# Patient Record
Sex: Male | Born: 1960
Health system: Southern US, Community
[De-identification: ages and names within clinical notes are randomized; demographics above are authoritative.]

## PROBLEM LIST (undated history)

## (undated) DIAGNOSIS — I10 Essential (primary) hypertension: Secondary | ICD-10-CM

## (undated) HISTORY — DX: Essential (primary) hypertension: I10

---

## 1980-09-12 HISTORY — PX: PILONIDAL CYST EXCISION: SHX744

## 2013-01-08 ENCOUNTER — Emergency Department (HOSPITAL_BASED_OUTPATIENT_CLINIC_OR_DEPARTMENT_OTHER)
Admission: EM | Admit: 2013-01-08 | Discharge: 2013-01-08 | Disposition: A | Discharge status: Home / Self Care | Payer: Managed Care, Other (non HMO) | Attending: Emergency Medicine | Admitting: Emergency Medicine

## 2013-01-08 ENCOUNTER — Emergency Department (HOSPITAL_BASED_OUTPATIENT_CLINIC_OR_DEPARTMENT_OTHER): Payer: Managed Care, Other (non HMO)

## 2013-01-08 ENCOUNTER — Encounter (HOSPITAL_BASED_OUTPATIENT_CLINIC_OR_DEPARTMENT_OTHER): Payer: Self-pay | Admitting: *Deleted

## 2013-01-08 DIAGNOSIS — R748 Abnormal levels of other serum enzymes: Secondary | ICD-10-CM | POA: Insufficient documentation

## 2013-01-08 DIAGNOSIS — R1013 Epigastric pain: Secondary | ICD-10-CM | POA: Insufficient documentation

## 2013-01-08 DIAGNOSIS — R109 Unspecified abdominal pain: Secondary | ICD-10-CM

## 2013-01-08 LAB — COMPREHENSIVE METABOLIC PANEL
ALT: 20 U/L (ref 0–53)
AST: 16 U/L (ref 0–37)
Albumin: 4.3 g/dL (ref 3.5–5.2)
Alkaline Phosphatase: 63 U/L (ref 39–117)
BUN: 16 mg/dL (ref 6–23)
CO2: 25 mEq/L (ref 19–32)
Calcium: 9.3 mg/dL (ref 8.4–10.5)
Chloride: 104 mEq/L (ref 96–112)
Creatinine, Ser: 1.1 mg/dL (ref 0.50–1.35)
GFR calc Af Amer: 87 mL/min — ABNORMAL LOW (ref >90)
GFR calc non Af Amer: 75 mL/min — ABNORMAL LOW (ref >90)
Glucose, Bld: 88 mg/dL (ref 70–99)
Potassium: 4.1 mEq/L (ref 3.5–5.1)
Sodium: 140 mEq/L (ref 135–145)
Total Bilirubin: 0.3 mg/dL (ref 0.3–1.2)
Total Protein: 7.5 g/dL (ref 6.0–8.3)

## 2013-01-08 LAB — CBC WITH DIFFERENTIAL/PLATELET
Basophils Absolute: 0.1 10*3/uL (ref 0.0–0.1)
Basophils Relative: 1 % (ref 0–1)
Eosinophils Absolute: 0.3 10*3/uL (ref 0.0–0.7)
Eosinophils Relative: 4 % (ref 0–5)
HCT: 41.1 % (ref 39.0–52.0)
Hemoglobin: 14.7 g/dL (ref 13.0–17.0)
Lymphocytes Relative: 27 % (ref 12–46)
Lymphs Abs: 1.9 10*3/uL (ref 0.7–4.0)
MCH: 31.3 pg (ref 26.0–34.0)
MCHC: 35.8 g/dL (ref 30.0–36.0)
MCV: 87.4 fL (ref 78.0–100.0)
Monocytes Absolute: 0.6 10*3/uL (ref 0.1–1.0)
Monocytes Relative: 9 % (ref 3–12)
Neutro Abs: 4.3 10*3/uL (ref 1.7–7.7)
Neutrophils Relative %: 61 % (ref 43–77)
Platelets: 277 10*3/uL (ref 150–400)
RBC: 4.7 MIL/uL (ref 4.22–5.81)
RDW: 12.6 % (ref 11.5–15.5)
WBC: 7.1 10*3/uL (ref 4.0–10.5)

## 2013-01-08 LAB — TROPONIN I: Troponin I: <0.3 ng/mL (ref <0.30)

## 2013-01-08 LAB — LIPASE, BLOOD: Lipase: 66 U/L — ABNORMAL HIGH (ref 11–59)

## 2013-01-08 MED ORDER — GI COCKTAIL ~~LOC~~: 30.0000 mL | Freq: Once | ORAL | Status: AC

## 2013-01-08 MED ORDER — MORPHINE SULFATE 4 MG/ML IJ SOLN
4.0000 mg | Freq: Once | INTRAMUSCULAR | Status: AC
  Administered 2013-01-08: 4 mg via INTRAVENOUS
  Filled 2013-01-08: qty 1

## 2013-01-08 MED ORDER — IOHEXOL 300 MG/ML  SOLN
50.0000 mL | Freq: Once | INTRAMUSCULAR | Status: AC | PRN
  Administered 2013-01-08: 50 mL via ORAL

## 2013-01-08 MED ORDER — IOHEXOL 300 MG/ML  SOLN
100.0000 mL | Freq: Once | INTRAMUSCULAR | Status: AC | PRN
  Administered 2013-01-08: 100 mL via INTRAVENOUS

## 2013-01-08 MED ORDER — GI COCKTAIL ~~LOC~~: 30.0000 mL | Freq: Two times a day (BID) | ORAL | Status: DC | PRN

## 2013-01-08 MED ORDER — HYDROCODONE-ACETAMINOPHEN 5-325 MG PO TABS: 2.0000 | ORAL_TABLET | ORAL | Status: DC | PRN

## 2013-01-08 MED ORDER — ONDANSETRON HCL 4 MG/2ML IJ SOLN
4.0000 mg | Freq: Once | INTRAMUSCULAR | Status: AC
  Administered 2013-01-08: 4 mg via INTRAVENOUS
  Filled 2013-01-08: qty 2

## 2013-01-08 MED ORDER — GI COCKTAIL ~~LOC~~
ORAL | Status: AC
  Administered 2013-01-08: 30 mL via ORAL
  Filled 2013-01-08: qty 30

## 2013-01-08 NOTE — ED Notes (Signed)
Pt drinking po contrast

## 2013-01-08 NOTE — ED Notes (Signed)
Pt placed on cardiac monitoring.  

## 2013-01-08 NOTE — ED Notes (Signed)
2 weeks ago he had an episode of abdominal pain and went to see the nurse at work. During the visit the nurse noticed he had an Irregular heartbeat.  Went to the work nurse today for a re-evaluation of pain in his abdomen and she sent him here. No symptoms.

## 2013-01-08 NOTE — ED Provider Notes (Signed)
History     CSN: [id number]  Arrival date & time 01/08/13  1356   First MD Initiated Contact with Patient 01/08/13 1409      Chief Complaint  Patient presents with  . Irregular Heart Beat    (Consider location/radiation/quality/duration/timing/severity/associated sxs/prior treatment) HPI Comments: Pt states that he had epigastric pain about 2 weeks ago and went to [facility]:pt states that they told him that his lipase was at the upper limits of normal:pt states that they told him if he started to have pain again then he needed to be reseen:pt states that he has also had nausea and diarrhea:the nurse at work told him that he had an irregular heart beat but it has never been documented:pt states that he was in [place] about 3 weeks ago and the diarrhea has been intermittent since although not bad enough in itself:pt denies any cp or sob  The history is provided by the patient. No language interpreter was used.    History reviewed. No pertinent past medical history.  History reviewed. No pertinent past surgical history.  No family history on file.  History  Substance Use Topics  . Smoking status: Never Smoker   . Smokeless tobacco: Not on file  . Alcohol Use: No      Review of Systems  Constitutional: Negative.   Respiratory: Negative.   Cardiovascular: Negative.     Allergies  Review of patient's allergies indicates no known allergies.  Home Medications  No current outpatient prescriptions on file.  BP 151/100  Pulse 56  Temp(Src) 98.4 F (36.9 C) (Oral)  Resp 18  Wt 210 lb (95.255 kg)  SpO2 100%  Physical Exam  Nursing note and vitals reviewed. Constitutional: He is oriented to person, place, and time. He appears well-developed and well-nourished.  HENT:  Head: Normocephalic and atraumatic.  Eyes: Conjunctivae and EOM are normal. Pupils are equal, round, and reactive to light.  Cardiovascular: Normal rate and regular rhythm.   Pulmonary/Chest: Effort  normal and breath sounds normal.  Abdominal: Soft. There is tenderness in the epigastric area.  Musculoskeletal: Normal range of motion.  Neurological: He is alert and oriented to person, place, and time.  Skin: Skin is warm and dry.  Psychiatric: He has a normal mood and affect.    ED Course  Procedures (including critical care time)  Labs Reviewed  COMPREHENSIVE METABOLIC PANEL - Abnormal; Notable for the following:    GFR calc non Af Amer 75 (*)    GFR calc Af Amer 87 (*)    All other components within normal limits  LIPASE, BLOOD - Abnormal; Notable for the following:    Lipase 66 (*)    All other components within normal limits  CBC WITH DIFFERENTIAL  TROPONIN I   Ct Abdomen Pelvis W Contrast  01/08/2013  *RADIOLOGY REPORT*  Clinical Data: Epigastric pain  CT ABDOMEN AND PELVIS WITH CONTRAST  Technique:  Multidetector CT imaging of the abdomen and pelvis was performed following the standard protocol during bolus administration of intravenous contrast.  Contrast: 50mL OMNIPAQUE IOHEXOL 300 MG/ML  SOLN, OMNIPAQUE IOHEXOL 300 MG/ML  SOLN  Comparison: None.  Findings: Lung bases are clear.  No pericardial fluid.  Low density lesions in the left hepatic lobe consistent benign hepatic cysts.  Similar cyst in the right hepatic lobe.  The gallbladder, pancreas, spleen, are normal hip.  There is small nodule in the left adrenal gland measuring 15 x 8 mm.  This cannot be fully characterized on  this exam but low density suggests adenoma.  The there is a low density lesion in the lower pole of the right kidney which likely represents a benign cyst.  No obstructive uropathy.  The stomach, small bowel, and cecum are normal.  Appendix not identified but there are no secondary signs of signs of appendicitis.  No pericecal inflammation.  Colon and rectosigmoid colon are normal.  Abdominal aorta is normal caliber.  No retroperitoneal or periportal lymphadenopathy.  No free fluid the pelvis.  No  pelvic lymphadenopathy.  Hysterectomy anatomy.  No aggressive osseous lesions.  IMPRESSION:  1.  No acute abdominal or pelvic findings. 2.  Multiple incidental findings are likely benign including hepatic cysts, right renal cyst, and left adrenal nodule.   Original Report Authenticated By: Genevive Bi, M.D.      Date: 01/08/2013  Rate: 58  Rhythm: sinus bradycardia  QRS Axis: normal  Intervals: normal  ST/T Wave abnormalities: normal  Conduction Disutrbances:none  Narrative Interpretation:   Old EKG Reviewed: none available    1. Elevated lipase   2. Abdominal pain       MDM  Pt got some relief with gi cocktail:pt is tolerating po without any problem:will give gi referral:pt states that he is going to set up pcp with Dr. Raquel James office:doubt acs        Teressa Lower, NP 01/08/13 [phone]

## 2013-01-10 NOTE — ED Provider Notes (Signed)
Medical screening examination/treatment/procedure(s) were performed by non-physician practitioner and as supervising physician I was immediately available for consultation/collaboration.   Cynitha Berte, MD 01/10/13 0709 

## 2013-07-19 ENCOUNTER — Encounter: Payer: Self-pay | Admitting: Family Medicine

## 2013-07-19 ENCOUNTER — Ambulatory Visit (INDEPENDENT_AMBULATORY_CARE_PROVIDER_SITE_OTHER): Payer: Managed Care, Other (non HMO) | Admitting: Family Medicine

## 2013-07-19 VITALS — BP 150/98 | HR 60 | Ht 71.0 in | Wt 215.0 lb

## 2013-07-19 DIAGNOSIS — Z1211 Encounter for screening for malignant neoplasm of colon: Secondary | ICD-10-CM

## 2013-07-19 DIAGNOSIS — I10 Essential (primary) hypertension: Secondary | ICD-10-CM

## 2013-07-19 DIAGNOSIS — Z125 Encounter for screening for malignant neoplasm of prostate: Secondary | ICD-10-CM

## 2013-07-19 DIAGNOSIS — Z13220 Encounter for screening for lipoid disorders: Secondary | ICD-10-CM

## 2013-07-19 HISTORY — DX: Essential (primary) hypertension: I10

## 2013-07-19 NOTE — Patient Instructions (Signed)
DASH Diet  The DASH diet stands for "Dietary Approaches to Stop Hypertension." It is a healthy eating plan that has been shown to reduce high blood pressure (hypertension) in as little as 14 days, while also possibly providing other significant health benefits. These other health benefits include reducing the risk of breast cancer after menopause and reducing the risk of type 2 diabetes, heart disease, colon cancer, and stroke. Health benefits also include weight loss and slowing kidney failure in patients with chronic kidney disease.   DIET GUIDELINES  · Limit salt (sodium). Your diet should contain less than 1500 mg of sodium daily.  · Limit refined or processed carbohydrates. Your diet should include mostly whole grains. Desserts and added sugars should be used sparingly.  · Include small amounts of heart-healthy fats. These types of fats include nuts, oils, and tub margarine. Limit saturated and trans fats. These fats have been shown to be harmful in the body.  CHOOSING FOODS   The following food groups are based on a 2000 calorie diet. See your Registered Dietitian for individual calorie needs.  Grains and Grain Products (6 to 8 servings daily)  · Eat More Often: Whole-wheat bread, brown rice, whole-grain or wheat pasta, quinoa, popcorn without added fat or salt (air popped).  · Eat Less Often: White bread, white pasta, white rice, cornbread.  Vegetables (4 to 5 servings daily)  · Eat More Often: Fresh, frozen, and canned vegetables. Vegetables may be raw, steamed, roasted, or grilled with a minimal amount of fat.  · Eat Less Often/Avoid: Creamed or fried vegetables. Vegetables in a cheese sauce.  Fruit (4 to 5 servings daily)  · Eat More Often: All fresh, canned (in natural juice), or frozen fruits. Dried fruits without added sugar. One hundred percent fruit juice (½ cup [237 mL] daily).  · Eat Less Often: Dried fruits with added sugar. Canned fruit in light or heavy syrup.  Lean Meats, Fish, and Poultry (2  servings or less daily. One serving is 3 to 4 oz [85-114 g]).  · Eat More Often: Ninety percent or leaner ground beef, tenderloin, sirloin. Round cuts of beef, chicken breast, turkey breast. All fish. Grill, bake, or broil your meat. Nothing should be fried.  · Eat Less Often/Avoid: Fatty cuts of meat, turkey, or chicken leg, thigh, or wing. Fried cuts of meat or fish.  Dairy (2 to 3 servings)  · Eat More Often: Low-fat or fat-free milk, low-fat plain or light yogurt, reduced-fat or part-skim cheese.  · Eat Less Often/Avoid: Milk (whole, 2%). Whole milk yogurt. Full-fat cheeses.  Nuts, Seeds, and Legumes (4 to 5 servings per week)  · Eat More Often: All without added salt.  · Eat Less Often/Avoid: Salted nuts and seeds, canned beans with added salt.  Fats and Sweets (limited)  · Eat More Often: Vegetable oils, tub margarines without trans fats, sugar-free gelatin. Mayonnaise and salad dressings.  · Eat Less Often/Avoid: Coconut oils, palm oils, butter, stick margarine, cream, half and half, cookies, candy, pie.  FOR MORE INFORMATION  The Dash Diet Eating Plan: www.dashdiet.org  Document Released: 08/18/2011 Document Revised: 11/21/2011 Document Reviewed: 08/18/2011  ExitCare® Patient Information ©2014 [employer].

## 2013-07-19 NOTE — Progress Notes (Signed)
CC: Jeremy House is a 52 y.o. male is here for Establish Care and Hypertension   Subjective: HPI:  Very pleasant 52 year old here to establish care  Patient is requesting counseling on health maintenance and cancer screening services he has never had a colonoscopy before his Jeremy House has prostate cancer in his Jeremy House his Jeremy House also has prostate cancer.  Patient reports that he has been told he has high blood pressure in the past. At one point he aggressively cut out sodium in his diet and with the help of his employer nurse he was able to monitor his blood pressure that eventually got 120/70 consistently on a low-salt diet. Nothing else particularly makes blood pressure better or worse. No outside blood pressures over the past months. It appears that he has had elevated blood pressure at least the last 6 months.  Review of Systems - General ROS: negative for - chills, fever, night sweats, weight gain or weight loss Ophthalmic ROS: negative for - decreased vision Psychological ROS: negative for - anxiety or depression ENT ROS: negative for - hearing change, nasal congestion, tinnitus or allergies Hematological and Lymphatic ROS: negative for - bleeding problems, bruising or swollen lymph nodes Breast ROS: negative Respiratory ROS: no cough, shortness of breath, or wheezing Cardiovascular ROS: no chest pain or dyspnea on exertion Gastrointestinal ROS: no abdominal pain, change in bowel habits, or black or bloody stools Genito-Urinary ROS: negative for - genital discharge, genital ulcers, incontinence or abnormal bleeding from genitals Musculoskeletal ROS: negative for - joint pain or muscle pain Neurological ROS: negative for - headaches or memory loss Dermatological ROS: negative for lumps, mole changes, rash and skin lesion changes  Past Medical History  Diagnosis Date  . Essential hypertension, benign 07/19/2013     Family History  Problem Relation Age of Onset  . Prostate cancer  Jeremy House      History  Substance Use Topics  . Smoking status: Never Smoker   . Smokeless tobacco: Not on file  . Alcohol Use: No     Objective: Filed Vitals:   07/19/13 1545  BP: 150/98  Pulse: 60    General: Alert and Oriented, No Acute Distress HEENT: Pupils equal, round, reactive to light. Conjunctivae clear.  Moist mucous membranes pharynx unremarkable Lungs: Clear to auscultation bilaterally, no wheezing/ronchi/rales.  Comfortable work of breathing. Good air movement. Cardiac: Regular rate and rhythm. Normal S1/S2.  No murmurs, rubs, nor gallops.   Extremities: No peripheral edema.  Strong peripheral pulses.  Mental Status: No depression, anxiety, nor agitation. Skin: Warm and dry.  Assessment & Plan: Jeremy House was seen today for establish care and hypertension.  Diagnoses and associated orders for this visit:  Essential hypertension, benign - BASIC METABOLIC PANEL WITH GFR  Lipid screening - Lipid Profile  Prostate cancer screening - PSA  Screening for colon cancer - Ambulatory referral to Gastroenterology    Essential hypertension: Checking renal function, we discussed the DASH diet, were both optimistic that he cut out salt he can get to a normotensive state again. He admits caving in on cravings for salty snacks all throughout the day. Patient is due for dyslipidemia screening and diabetic screening. With the upcoming CPE we will obtain PSA given family history of prostate cancer Referral for colonoscopy  Return in about 1 week (around 07/26/2013).

## 2013-07-22 LAB — LIPID PANEL
Cholesterol: 160 mg/dL (ref 0–200)
HDL: 31 mg/dL — ABNORMAL LOW (ref >39)
LDL Cholesterol: 75 mg/dL (ref 0–99)
Total CHOL/HDL Ratio: 5.2 Ratio
Triglycerides: 271 mg/dL — ABNORMAL HIGH (ref <150)
VLDL: 54 mg/dL — ABNORMAL HIGH (ref 0–40)

## 2013-07-22 LAB — BASIC METABOLIC PANEL WITH GFR
BUN: 17 mg/dL (ref 6–23)
CO2: 26 mEq/L (ref 19–32)
Calcium: 9.1 mg/dL (ref 8.4–10.5)
Chloride: 106 mEq/L (ref 96–112)
Creat: 1.18 mg/dL (ref 0.50–1.35)
GFR, Est African American: 82 mL/min
GFR, Est Non African American: 71 mL/min
Glucose, Bld: 88 mg/dL (ref 70–99)
Potassium: 4.6 mEq/L (ref 3.5–5.3)
Sodium: 140 mEq/L (ref 135–145)

## 2013-07-22 LAB — PSA: PSA: 0.91 ng/mL (ref <=4.00)

## 2013-07-23 ENCOUNTER — Encounter: Payer: Self-pay | Admitting: Family Medicine

## 2013-07-23 DIAGNOSIS — E781 Pure hyperglyceridemia: Secondary | ICD-10-CM | POA: Insufficient documentation

## 2013-07-25 ENCOUNTER — Encounter: Payer: Self-pay | Admitting: Family Medicine

## 2013-07-25 ENCOUNTER — Ambulatory Visit (INDEPENDENT_AMBULATORY_CARE_PROVIDER_SITE_OTHER): Payer: Managed Care, Other (non HMO) | Admitting: Family Medicine

## 2013-07-25 VITALS — BP 140/90 | HR 78 | Ht 71.0 in | Wt 214.0 lb

## 2013-07-25 DIAGNOSIS — E781 Pure hyperglyceridemia: Secondary | ICD-10-CM

## 2013-07-25 DIAGNOSIS — Z0000 Encounter for general adult medical examination without abnormal findings: Secondary | ICD-10-CM

## 2013-07-25 DIAGNOSIS — H9190 Unspecified hearing loss, unspecified ear: Secondary | ICD-10-CM

## 2013-07-25 DIAGNOSIS — Z23 Encounter for immunization: Secondary | ICD-10-CM

## 2013-07-25 NOTE — Progress Notes (Signed)
CC: Jeremy House is a 52 y.o. male is here for Annual Exam   Subjective: HPI:  Colonoscopy: Referral placed last week. Prostate: Discussed screening risks/beneifts with patient on 07/25/2013, PSA last week normal  Influenza Vaccine: flu today Pneumovax: no current indication Td/Tdap:  Td > 10 years ago, will receive today Zoster: (Start 52 yo)  Over the past 2 weeks have you been bothered by: - Little interest or pleasure in doing things: No - Feeling down depressed or hopeless: No  No tobacco alcohol or recreational drug use. He is getting back in the habit of exercising most days of the week. He is cutting back on salt in his diet.  His only complaint today involves hearing loss at high frequencies it is described as mild in severity he is wondering if he needs to be concerned about this and if he needs hearing aids. Symptoms in the present for over a year not getting better or worsens onset nothing particularly makes better or worse  Review of Systems - General ROS: negative for - chills, fever, night sweats, weight gain or weight loss Ophthalmic ROS: negative for - decreased vision Psychological ROS: negative for - anxiety or depression ENT ROS: negative for -  nasal congestion, tinnitus or allergies Hematological and Lymphatic ROS: negative for - bleeding problems, bruising or swollen lymph nodes Breast ROS: negative Respiratory ROS: no cough, shortness of breath, or wheezing Cardiovascular ROS: no chest pain or dyspnea on exertion Gastrointestinal ROS: no abdominal pain, change in bowel habits, or black or bloody stools Genito-Urinary ROS: negative for - genital discharge, genital ulcers, incontinence or abnormal bleeding from genitals Musculoskeletal ROS: negative for - joint pain or muscle pain Neurological ROS: negative for - headaches or memory loss Dermatological ROS: negative for lumps, mole changes, rash and skin lesion changes  Past Medical History  Diagnosis Date   . Essential hypertension, benign 07/19/2013     Family History  Problem Relation Age of Onset  . Prostate cancer Father      History  Substance Use Topics  . Smoking status: Never Smoker   . Smokeless tobacco: Not on file  . Alcohol Use: No     Objective: Filed Vitals:   07/25/13 0835  BP: 140/90  Pulse: 78   General: No Acute Distress HEENT: Atraumatic, normocephalic, conjunctivae normal without scleral icterus.  No nasal discharge, hearing grossly intact, TMs with good landmarks bilaterally with no middle ear abnormalities, posterior pharynx clear without oral lesions. Neck: Supple, trachea midline, no cervical nor supraclavicular adenopathy. Pulmonary: Clear to auscultation bilaterally without wheezing, rhonchi, nor rales. Cardiac: Regular rate and rhythm.  No murmurs, rubs, nor gallops. No peripheral edema.  2+ peripheral pulses bilaterally. Abdomen: Bowel sounds normal.  No masses.  Non-tender without rebound.  Negative Murphy's sign. GU: No inguinal hernia, bilateral descended nontender testes MSK: Grossly intact, no signs of weakness.  Full strength throughout upper and lower extremities.  Full ROM in upper and lower extremities.  No midline spinal tenderness. Neuro: Gait unremarkable, CN II-XII grossly intact.  C5-C6 Reflex 2/4 Bilaterally, L4 Reflex 2/4 Bilaterally.  Cerebellar function intact. Skin: No rashes. Scattered seb K's on back non inflammed.  Psych: Alert and oriented to person/place/time.  Thought process normal. No anxiety/depression.  Assessment & Plan: Amadi was seen today for annual exam.  Diagnoses and associated orders for this visit:  Annual physical exam  Hearing problem, unspecified laterality  Hypertriglyceridemia    Audiometry was performed today all 4 frequencies were easily  heard at 20 dB ranging from 50-4000 Hz,   Healthy lifestyle interventions including but limited to regular exercise, a healthy low fat diet, moderation of salt  intake, the dangers of tobacco/alcohol/recreational drug use, nutrition supplementation, and accident avoidance were discussed with the patient and a handout was provided for future reference. Hypertriglyceridemia: Start fish oil shoot for 3 g EPA-DHA on a daily basis repeat lipids 3 months.  Return in about 3 months (around 10/25/2013).

## 2013-07-25 NOTE — Patient Instructions (Addendum)
Dr. Teigen Parslow's General Advice Following Your Complete Physical Exam  The Benefits of Regular Exercise: Unless you suffer from an uncontrolled cardiovascular condition, studies strongly suggest that regular exercise and physical activity will add to both the quality and length of your life.  The World Health Organization recommends 150 minutes of moderate intensity aerobic activity every week.  This is best split over 3-4 days a week, and can be as simple as a brisk walk for just over 35 minutes "most days of the week".  This type of exercise has been shown to lower LDL-Cholesterol, lower average blood sugars, lower blood pressure, lower cardiovascular disease risk, improve memory, and increase one's overall sense of wellbeing.  The addition of anaerobic (or "strength training") exercises offers additional benefits including but not limited to increased metabolism, prevention of osteoporosis, and improved overall cholesterol levels.  How Can I Strive For A Low-Fat Diet?: Current guidelines recommend that 25-35 percent of your daily energy (food) intake should come from fats.  One might ask how can this be achieved without having to dissect each meal on a daily basis?  Switch to skim or 1% milk instead of whole milk.  Focus on lean meats such as ground turkey, fresh fish, baked chicken, and lean cuts of beef as your source of dietary protein.  Consume less than 300mg/day of dietary cholesterol.  Limit trans fatty acid consumption primarily by limiting synthetic trans fats such as partially hydrogenated oils (Ex: fried fast foods).  Focus efforts on reducing your intake of "solid" fats (Ex: Butter).  Substitute olive or vegetable oil for solid fats where possible.  Moderation of Salt Intake: Provided you don't carry a diagnosis of congestive heart failure nor renal failure, I recommend a daily allowance of no more than 2300 mg of salt (sodium).  Keeping under this daily goal is associated with a  decreased risk of cardiovascular events, creeping above it can lead to elevated blood pressures and increases your risk of cardiovascular events.  Milligrams (mg) of salt is listed on all nutrition labels, and your daily intake can add up faster than you think.  Most canned and frozen dinners can pack in over half your daily salt allowance in one meal.    Lifestyle Health Risks: Certain lifestyle choices carry specific health risks.  As you may already know, tobacco use has been associated with increasing one's risk of cardiovascular disease, pulmonary disease, numerous cancers, among many other issues.  What you may not know is that there are medications and nicotine replacement strategies that can more than double your chances of successfully quitting.  I would be thrilled to help manage your quitting strategy if you currently use tobacco products.  When it comes to alcohol use, I've yet to find an "ideal" daily allowance.  Provided an individual does not have a medical condition that is exacerbated by alcohol consumption, general guidelines determine "safe drinking" as no more than two standard drinks for a man or no more than one standard drink for a male per day.  However, much debate still exists on whether any amount of alcohol consumption is technically "safe".  My general advice, keep alcohol consumption to a minimum for general health promotion.  If you or others believe that alcohol, tobacco, or recreational drug use is interfering with your life, I would be happy to provide confidential counseling regarding treatment options.  General "Over The Counter" Nutrition Advice: Postmenopausal women should aim for a daily calcium intake of 1200 mg, however a significant   portion of this might already be provided by diets including milk, yogurt, cheese, and other dairy products.  Vitamin D has been shown to help preserve bone density, prevent fatigue, and has even been shown to help reduce falls in the  elderly.  Ensuring a daily intake of 800 Units of Vitamin D is a good place to start to enjoy the above benefits, we can easily check your Vitamin D level to see if you'd potentially benefit from supplementation beyond 800 Units a day.  Folic Acid intake should be of particular concern to women of childbearing age.  Daily consumption of 400-800 mcg of Folic Acid is recommended to minimize the chance of spinal cord defects in a fetus should pregnancy occur.    For many adults, accidents still remain one of the most common culprits when it comes to cause of death.  Some of the simplest but most effective preventitive habits you can adopt include regular seatbelt use, proper helmet use, securing firearms, and regularly testing your smoke and carbon monoxide detectors.  Jovontae Banko B. Becki Mccaskill DO [facility] [address], [address] [place], [place] [address] Phone: [phone]   Testicular Self-Exam A self-examination of your testicles involves looking at and feeling your testicles for abnormal lumps or swelling. Several things can cause swelling, lumps, or pain in your testicles. Some of these causes are:  Injuries.  Inflammation.  Infection.  Accumulation of fluids around your testicle (hydrocele).  Twisted testicles (testicular torsion).  Testicular cancer. Self-examination of the testicles and groin areas may be advised if you are at risk for testicular cancer. Risks for testicular cancer include:  An undescended testicle (cryptorchidism).  A history of previous testicular cancer.  A family history of testicular cancer. The testicles are easiest to examine after warm baths or showers and are more difficult to examine when you are cold. This is because the muscles attached to the testicles retract and pull them up higher or into the abdomen. Follow these steps while you are standing:  Hold your penis away from your body.  Roll one testicle between your thumb and forefinger,  feeling the entire testicle.  Roll the other testicle between your thumb and forefinger, feeling the entire testicle. Feel for lumps, swelling, or discomfort. A normal testicle is egg shaped and feels firm. It is smooth and not tender. The spermatic cord can be felt as a firm spaghetti-like cord at the back of your testicle. It is also important to examine the crease between the front of your leg and your abdomen. Feel for any bumps that are tender. These could be enlarged lymph nodes.  Document Released: 12/05/2000 Document Revised: 05/01/2013 Document Reviewed: 02/18/2013 ExitCare Patient Information 2014 [employer].  

## 2013-07-25 NOTE — Addendum Note (Signed)
Addended by: Pixie Casino on: 07/25/2013 09:35 AM   Modules accepted: Orders

## 2013-07-26 ENCOUNTER — Ambulatory Visit (AMBULATORY_SURGERY_CENTER): Payer: Managed Care, Other (non HMO) | Admitting: *Deleted

## 2013-07-26 VITALS — Ht 71.0 in | Wt 216.4 lb

## 2013-07-26 DIAGNOSIS — Z1211 Encounter for screening for malignant neoplasm of colon: Secondary | ICD-10-CM

## 2013-07-26 MED ORDER — MOVIPREP 100 G PO SOLR: 1.0000 | Freq: Once | ORAL | Status: DC

## 2013-07-26 NOTE — Progress Notes (Signed)
No egg or soy allergy. No anesthesia problems.  

## 2013-08-01 ENCOUNTER — Encounter: Payer: Self-pay | Admitting: Internal Medicine

## 2013-08-19 ENCOUNTER — Encounter: Payer: Self-pay | Admitting: Internal Medicine

## 2013-08-19 ENCOUNTER — Ambulatory Visit (AMBULATORY_SURGERY_CENTER): Payer: Managed Care, Other (non HMO) | Admitting: Internal Medicine

## 2013-08-19 VITALS — BP 142/100 | HR 59 | Temp 97.9°F | Resp 16 | Ht 71.0 in | Wt 216.0 lb

## 2013-08-19 DIAGNOSIS — D126 Benign neoplasm of colon, unspecified: Secondary | ICD-10-CM

## 2013-08-19 DIAGNOSIS — Z1211 Encounter for screening for malignant neoplasm of colon: Secondary | ICD-10-CM

## 2013-08-19 MED ORDER — SODIUM CHLORIDE 0.9 % IV SOLN: 500.0000 mL | INTRAVENOUS | Status: DC

## 2013-08-19 NOTE — Progress Notes (Signed)
Pt. Has Kindle under stretcher.

## 2013-08-19 NOTE — Op Note (Signed)
[facility] [address] [place], [address]   COLONOSCOPY PROCEDURE REPORT  PATIENT: Jeremy House, Jeremy House  MR#: [id number] BIRTHDATE: 20-Jan-1961 , 52  yrs. old GENDER: Male ENDOSCOPIST: Beverley Fiedler, MD REFERRED BY: Laren Boom, MD PROCEDURE DATE:  08/19/2013 PROCEDURE:   Colonoscopy with cold biopsy polypectomy First Screening Colonoscopy - Avg.  risk and is 50 yrs.  old or older Yes.  Prior Negative Screening - Now for repeat screening. N/A  History of Adenoma - Now for follow-up colonoscopy & has been > or = to 3 yrs.  N/A  Polyps Removed Today? Yes. ASA CLASS:   Class I INDICATIONS:average risk screening and first colonoscopy. MEDICATIONS: MAC sedation, administered by CRNA and propofol (Diprivan) 300mg  IV  DESCRIPTION OF PROCEDURE:   After the risks benefits and alternatives of the procedure were thoroughly explained, informed consent was obtained.  A digital rectal exam revealed no rectal mass.   The LB [id number]  endoscope was introduced through the anus and advanced to the terminal ileum which was intubated for a short distance. No adverse events experienced.   The quality of the prep was good, using MoviPrep  The instrument was then slowly withdrawn as the colon was fully examined.   COLON FINDINGS: The mucosa appeared normal in the terminal ileum. A sessile polyp measuring 3 mm in size was found at the cecum.  A polypectomy was performed with cold forceps.  The resection was complete and the polyp tissue was completely retrieved.   The colon mucosa was otherwise normal.  Retroflexed views revealed no abnormalities. The time to cecum=2 minutes 15 seconds.  Withdrawal time=12 minutes 25 seconds.  The scope was withdrawn and the procedure completed. COMPLICATIONS: There were no complications.  ENDOSCOPIC IMPRESSION: 1.   Normal mucosa in the terminal ileum 2.   Sessile polyp measuring 3 mm in size was found at the cecum; polypectomy was performed  with cold forceps 3.   The colon mucosa was otherwise normal  RECOMMENDATIONS: 1.  Await pathology results 2.  If the polyp removed today is proven to be an adenomatous (pre-cancerous) polyp, you will need a repeat colonoscopy in 5 years.  Otherwise you should continue to follow colorectal cancer screening guidelines for "routine risk" patients with colonoscopy in 10 years.  You will receive a letter within 1-2 weeks with the results of your biopsy as well as final recommendations.  Please call my office if you have not received a letter after 3 weeks.   eSigned:  Beverley Fiedler, MD 08/19/2013 4:04 PM  cc: The Patient; Laren Boom, MD

## 2013-08-19 NOTE — Patient Instructions (Signed)
YOU HAD AN ENDOSCOPIC PROCEDURE TODAY AT THE [facility]: Refer to the procedure report that was given to you for any specific questions about what was found during the examination.  If the procedure report does not answer your questions, please call your gastroenterologist to clarify.  If you requested that your care partner not be given the details of your procedure findings, then the procedure report has been included in a sealed envelope for you to review at your convenience later.  YOU SHOULD EXPECT: Some feelings of bloating in the abdomen. Passage of more gas than usual.  Walking can help get rid of the air that was put into your GI tract during the procedure and reduce the bloating. If you had a lower endoscopy (such as a colonoscopy or flexible sigmoidoscopy) you may notice spotting of blood in your stool or on the toilet paper. If you underwent a bowel prep for your procedure, then you may not have a normal bowel movement for a few days.  DIET: Your first meal following the procedure should be a light meal and then it is ok to progress to your normal diet.  A half-sandwich or bowl of soup is an example of a good first meal.  Heavy or fried foods are harder to digest and may make you feel nauseous or bloated.  Likewise meals heavy in dairy and vegetables can cause extra gas to form and this can also increase the bloating.  Drink plenty of fluids but you should avoid alcoholic beverages for 24 hours.  ACTIVITY: Your care partner should take you home directly after the procedure.  You should plan to take it easy, moving slowly for the rest of the day.  You can resume normal activity the day after the procedure however you should NOT DRIVE or use heavy machinery for 24 hours (because of the sedation medicines used during the test).    SYMPTOMS TO REPORT IMMEDIATELY: A gastroenterologist can be reached at any hour.  During normal business hours, 8:30 AM to 5:00 PM Monday through Friday,  call [phone].  After hours and on weekends, please call the GI answering service at [phone] who will take a message and have the physician on call contact you.   Following lower endoscopy (colonoscopy or flexible sigmoidoscopy):  Excessive amounts of blood in the stool  Significant tenderness or worsening of abdominal pains  Swelling of the abdomen that is new, acute  Fever of 100F or higher   FOLLOW UP: If any biopsies were taken you will be contacted by phone or by letter within the next 1-3 weeks.  Call your gastroenterologist if you have not heard about the biopsies in 3 weeks.  Our staff will call the home number listed on your records the next business day following your procedure to check on you and address any questions or concerns that you may have at that time regarding the information given to you following your procedure. This is a courtesy call and so if there is no answer at the home number and we have not heard from you through the emergency physician on call, we will assume that you have returned to your regular daily activities without incident.  SIGNATURES/CONFIDENTIALITY: You and/or your care partner have signed paperwork which will be entered into your electronic medical record.  These signatures attest to the fact that that the information above on your After Visit Summary has been reviewed and is understood.  Full responsibility of the confidentiality of   this discharge information lies with you and/or your care-partner.   A handout was given to your care partner on polyps. You may resume your current medications today. Await pathology result. Please call if any questions or concerns.

## 2013-08-19 NOTE — Progress Notes (Signed)
Called to room to assist during endoscopic procedure.  Patient ID and intended procedure confirmed with present staff. Received instructions for my participation in the procedure from the performing physician.  

## 2013-08-19 NOTE — Progress Notes (Signed)
No complaints noted in the recovery room. Maw   

## 2013-08-20 ENCOUNTER — Telehealth: Payer: Self-pay | Admitting: *Deleted

## 2013-08-20 NOTE — Telephone Encounter (Signed)
  Follow up Call-  Call back number 08/19/2013  Post procedure Call Back phone  # [phone]  Permission to leave phone message Yes     Patient questions:  Do you have a fever, pain , or abdominal swelling? no Pain Score  0 *  Have you tolerated food without any problems? yes  Have you been able to return to your normal activities? yes  Do you have any questions about your discharge instructions: Diet   no Medications  no Follow up visit  no  Do you have questions or concerns about your Care? no  Actions: * If pain score is 4 or above: No action needed, pain <4.

## 2013-08-28 ENCOUNTER — Encounter: Payer: Self-pay | Admitting: Internal Medicine

## 2014-02-24 ENCOUNTER — Telehealth: Payer: Self-pay | Admitting: Family Medicine

## 2014-02-24 NOTE — Telephone Encounter (Signed)
Left message on vm for pt to schedule an appt and to appropiate labs can be ordered then

## 2014-02-24 NOTE — Telephone Encounter (Signed)
Pt is due for an appt. Needs f/u and can get labs ordered at the visit

## 2014-02-24 NOTE — Telephone Encounter (Signed)
Pt called. He wants lab Order(Dr Ileene Rubens wants to repeat lab work) sent down on 6/23.  Thank you.

## 2014-02-27 ENCOUNTER — Telehealth: Payer: Self-pay | Admitting: *Deleted

## 2014-02-27 DIAGNOSIS — I10 Essential (primary) hypertension: Secondary | ICD-10-CM

## 2014-02-27 DIAGNOSIS — E781 Pure hyperglyceridemia: Secondary | ICD-10-CM

## 2014-02-27 NOTE — Telephone Encounter (Signed)
Pt called and said he is supposed to come back for labs this month and is usually out of town working.  He would like to have labs drawn before he sees you and then come back to review.  He is flying out of [facility] Tuesday morning and would like to have labs drawn that morning.  Please advise.  KG CMA

## 2014-02-27 NOTE — Telephone Encounter (Signed)
Seth Bake, Order placed in in-box ready for pickup/faxing.

## 2014-02-28 NOTE — Telephone Encounter (Signed)
faxed

## 2014-03-18 LAB — BASIC METABOLIC PANEL WITH GFR
BUN: 17 mg/dL (ref 6–23)
CO2: 24 mEq/L (ref 19–32)
Calcium: 9.4 mg/dL (ref 8.4–10.5)
Chloride: 102 mEq/L (ref 96–112)
Creat: 1.08 mg/dL (ref 0.50–1.35)
GFR, Est African American: >89 mL/min
GFR, Est Non African American: 78 mL/min
Glucose, Bld: 76 mg/dL (ref 70–99)
Potassium: 4.7 mEq/L (ref 3.5–5.3)
Sodium: 139 mEq/L (ref 135–145)

## 2014-03-18 LAB — LIPID PANEL
Cholesterol: 190 mg/dL (ref 0–200)
HDL: 34 mg/dL — ABNORMAL LOW (ref >39)
LDL Cholesterol: 93 mg/dL (ref 0–99)
Total CHOL/HDL Ratio: 5.6 Ratio
Triglycerides: 316 mg/dL — ABNORMAL HIGH (ref <150)
VLDL: 63 mg/dL — ABNORMAL HIGH (ref 0–40)

## 2014-03-19 ENCOUNTER — Encounter: Payer: Self-pay | Admitting: Family Medicine

## 2014-06-23 ENCOUNTER — Telehealth: Payer: Self-pay | Admitting: Family Medicine

## 2014-06-23 NOTE — Telephone Encounter (Signed)
Patient scheduled his f/u LABS appt with Dr.Hommel on Monday 06/30/14 at [facility] and patient would like for the nurse to call him and let him know if he needs to fast for his upcoming appt? Thanks

## 2014-06-24 NOTE — Telephone Encounter (Signed)
Pt notified to come in fasting; he has been taking fish oil

## 2014-06-30 ENCOUNTER — Ambulatory Visit (INDEPENDENT_AMBULATORY_CARE_PROVIDER_SITE_OTHER): Payer: Managed Care, Other (non HMO) | Admitting: Family Medicine

## 2014-06-30 ENCOUNTER — Encounter: Payer: Self-pay | Admitting: Family Medicine

## 2014-06-30 VITALS — BP 150/95 | HR 66 | Wt 210.0 lb

## 2014-06-30 DIAGNOSIS — I10 Essential (primary) hypertension: Secondary | ICD-10-CM

## 2014-06-30 DIAGNOSIS — E781 Pure hyperglyceridemia: Secondary | ICD-10-CM | POA: Diagnosis not present

## 2014-06-30 NOTE — Progress Notes (Signed)
CC: Jeremy House is a 53 y.o. male is here for f/u labs and concerned about elevated BP   Subjective: HPI:  Followup of essential hypertension: No outside blood pressures to report. He reports some fatigue but denies chest pain shortness of breath orthopnea peripheral edema nor motor or sensory disturbances. He reports that he is slacking on watching his sodium intake. He reports that he eats out quite frequently now that he is commuting up to 4 hours a day.   Followup hyperlipidemia: He has been taking 2 capsules of fish oil on a daily basis without any known side effects.  Denies right upper quadrant pain or epigastric discomfort. Unable to find time for working out, no formal exercise routine. Denies claudication.   Review Of Systems Outlined In HPI  Past Medical History  Diagnosis Date  . Essential hypertension, benign 07/19/2013    Past Surgical History  Procedure Laterality Date  . Pilonidal cyst excision  1982   Family History  Problem Relation Age of Onset  . Prostate cancer Father   . Colon cancer Maternal Grandfather     History   Social History  . Marital Status: Married    Spouse Name: N/A    Number of Children: N/A  . Years of Education: N/A   Occupational History  . Not on file.   Social History Main Topics  . Smoking status: Never Smoker   . Smokeless tobacco: Never Used  . Alcohol Use: No  . Drug Use: No  . Sexual Activity: Not on file   Other Topics Concern  . Not on file   Social History Narrative  . No narrative on file     Objective: BP 150/95  Pulse 66  Wt 210 lb (95.255 kg)  Vital signs reviewed. General: Alert and Oriented, No Acute Distress HEENT: Pupils equal, round, reactive to light. Conjunctivae clear.  External ears unremarkable.  Moist mucous membranes. Lungs: Clear and comfortable work of breathing, speaking in full sentences without accessory muscle use. Cardiac: Regular rate and rhythm.  Neuro: CN II-XII grossly intact, gait  normal. Extremities: No peripheral edema.  Strong peripheral pulses.  Mental Status: No depression, anxiety, nor agitation. Logical though process. Skin: Warm and dry.  Assessment & Plan: Romani was seen today for f/u labs and concerned about elevated bp.  Diagnoses and associated orders for this visit:  Essential hypertension, benign  Hypertriglyceridemia - Lipid panel     Essential hypertension: Uncontrolled chronic condition discussed the DASH diet and focusing on less than 2000 mg of sodium a daily basis. We're both optimistic that he has lots of room for improvement and he'll be successful in cutting back. Recheck blood pressure in 3 months. Hypertriglyceridemia: Checking lipid panel today, discussed medication options and he is open to the idea of vascepa if current over-the-counter capsules a day is not providing numbers less than 150 today Return in about 3 months (around 09/30/2014) for BP and Triglycerides Check.

## 2014-06-30 NOTE — Patient Instructions (Signed)
DASH Eating Plan DASH stands for "Dietary Approaches to Stop Hypertension." The DASH eating plan is a healthy eating plan that has been shown to reduce high blood pressure (hypertension). Additional health benefits may include reducing the risk of type 2 diabetes mellitus, heart disease, and stroke. The DASH eating plan may also help with weight loss.   Daily sodium goal: Less than 2000mg  daily.  WHAT DO I NEED TO KNOW ABOUT THE DASH EATING PLAN? For the DASH eating plan, you will follow these general guidelines:  Choose foods with a percent daily value for sodium of less than 5% (as listed on the food label).  Use salt-free seasonings or herbs instead of table salt or sea salt.  Check with your health care provider or pharmacist before using salt substitutes.  Eat lower-sodium products, often labeled as "lower sodium" or "no salt added."  Eat fresh foods.  Eat more vegetables, fruits, and low-fat dairy products.  Choose whole grains. Look for the word "whole" as the first word in the ingredient list.  Choose fish and skinless chicken or [place] more often than red meat. Limit fish, poultry, and meat to 6 oz (170 g) each day.  Limit sweets, desserts, sugars, and sugary drinks.  Choose heart-healthy fats.  Limit cheese to 1 oz (28 g) per day.  Eat more home-cooked food and less restaurant, buffet, and fast food.  Limit fried foods.  Cook foods using methods other than frying.  Limit canned vegetables. If you do use them, rinse them well to decrease the sodium.  When eating at a restaurant, ask that your food be prepared with less salt, or no salt if possible. WHAT FOODS CAN I EAT? Seek help from a dietitian for individual calorie needs. Grains Whole grain or whole wheat bread. Brown rice. Whole grain or whole wheat pasta. Quinoa, bulgur, and whole grain cereals. Low-sodium cereals. Corn or whole wheat flour tortillas. Whole grain cornbread. Whole grain crackers. Low-sodium  crackers. Vegetables Fresh or frozen vegetables (raw, steamed, roasted, or grilled). Low-sodium or reduced-sodium tomato and vegetable juices. Low-sodium or reduced-sodium tomato sauce and paste. Low-sodium or reduced-sodium canned vegetables.  Fruits All fresh, canned (in natural juice), or frozen fruits. Meat and Other Protein Products Ground beef (85% or leaner), grass-fed beef, or beef trimmed of fat. Skinless chicken or [place]. Ground chicken or [place]. Pork trimmed of fat. All fish and seafood. Eggs. Dried beans, peas, or lentils. Unsalted nuts and seeds. Unsalted canned beans. Dairy Low-fat dairy products, such as skim or 1% milk, 2% or reduced-fat cheeses, low-fat ricotta or cottage cheese, or plain low-fat yogurt. Low-sodium or reduced-sodium cheeses. Fats and Oils Tub margarines without trans fats. Light or reduced-fat mayonnaise and salad dressings (reduced sodium). Avocado. Safflower, olive, or canola oils. Natural peanut or almond butter. Other Unsalted popcorn and pretzels. The items listed above may not be a complete list of recommended foods or beverages. Contact your dietitian for more options. WHAT FOODS ARE NOT RECOMMENDED? Grains White bread. White pasta. White rice. Refined cornbread. Bagels and croissants. Crackers that contain trans fat. Vegetables Creamed or fried vegetables. Vegetables in a cheese sauce. Regular canned vegetables. Regular canned tomato sauce and paste. Regular tomato and vegetable juices. Fruits Dried fruits. Canned fruit in light or heavy syrup. Fruit juice. Meat and Other Protein Products Fatty cuts of meat. Ribs, chicken wings, bacon, sausage, bologna, salami, chitterlings, fatback, hot dogs, bratwurst, and packaged luncheon meats. Salted nuts and seeds. Canned beans with salt. Dairy Whole or 2% milk,  cream, half-and-half, and cream cheese. Whole-fat or sweetened yogurt. Full-fat cheeses or blue cheese. Nondairy creamers and whipped toppings.  Processed cheese, cheese spreads, or cheese curds. Condiments Onion and garlic salt, seasoned salt, table salt, and sea salt. Canned and packaged gravies. Worcestershire sauce. Tartar sauce. Barbecue sauce. Teriyaki sauce. Soy sauce, including reduced sodium. Steak sauce. Fish sauce. Oyster sauce. Cocktail sauce. Horseradish. Ketchup and mustard. Meat flavorings and tenderizers. Bouillon cubes. Hot sauce. Tabasco sauce. Marinades. Taco seasonings. Relishes. Fats and Oils Butter, stick margarine, lard, shortening, ghee, and bacon fat. Coconut, palm kernel, or palm oils. Regular salad dressings. Other Pickles and olives. Salted popcorn and pretzels. The items listed above may not be a complete list of foods and beverages to avoid. Contact your dietitian for more information. WHERE CAN I FIND MORE INFORMATION? National Heart, Lung, and Blood Institute: travelstabloid.com Document Released: 08/18/2011 Document Revised: 01/13/2014 Document Reviewed: 07/03/2013 [facility] Patient Information 2015 [place], [place]. This information is not intended to replace advice given to you by your health care provider. Make sure you discuss any questions you have with your health care provider.

## 2014-07-01 ENCOUNTER — Telehealth: Payer: Self-pay | Admitting: Family Medicine

## 2014-07-01 DIAGNOSIS — E781 Pure hyperglyceridemia: Secondary | ICD-10-CM

## 2014-07-01 LAB — LIPID PANEL
Cholesterol: 161 mg/dL (ref 0–200)
HDL: 28 mg/dL — ABNORMAL LOW (ref >39)
LDL Cholesterol: 85 mg/dL (ref 0–99)
Total CHOL/HDL Ratio: 5.8 Ratio
Triglycerides: 242 mg/dL — ABNORMAL HIGH (ref <150)
VLDL: 48 mg/dL — ABNORMAL HIGH (ref 0–40)

## 2014-07-01 MED ORDER — ICOSAPENT ETHYL 1 G PO CAPS: ORAL_CAPSULE | ORAL | Status: DC

## 2014-07-01 NOTE — Telephone Encounter (Signed)
Pt notified card up front

## 2014-07-01 NOTE — Telephone Encounter (Signed)
Jeremy House, Will you please let patient know that his LDL cholesterol is controlled however triglycerides remain moderately elevated.  As we discussed during his visit I'd recommend he start on a purified formula of fish oil that i've sent to his pharmacy.  He'll need a savings card to help make this affordable (can you please provide him one -- Vascepa --- from the sample closet).  Return 3 months.

## 2015-02-23 ENCOUNTER — Telehealth: Payer: Self-pay | Admitting: *Deleted

## 2015-02-23 DIAGNOSIS — E781 Pure hyperglyceridemia: Secondary | ICD-10-CM

## 2015-02-23 NOTE — Telephone Encounter (Signed)
Lipid panel ordered for a recheck of triglycerides.

## 2015-02-24 LAB — LIPID PANEL
Cholesterol: 163 mg/dL (ref 0–200)
HDL: 30 mg/dL — ABNORMAL LOW (ref >=40)
LDL Cholesterol: 90 mg/dL (ref 0–99)
Total CHOL/HDL Ratio: 5.4 Ratio
Triglycerides: 215 mg/dL — ABNORMAL HIGH (ref <150)
VLDL: 43 mg/dL — ABNORMAL HIGH (ref 0–40)

## 2015-03-30 ENCOUNTER — Encounter: Payer: Self-pay | Admitting: Family Medicine

## 2015-03-30 ENCOUNTER — Ambulatory Visit (INDEPENDENT_AMBULATORY_CARE_PROVIDER_SITE_OTHER): Payer: BLUE CROSS/BLUE SHIELD | Admitting: Family Medicine

## 2015-03-30 VITALS — BP 138/93 | HR 64 | Wt 210.0 lb

## 2015-03-30 DIAGNOSIS — R208 Other disturbances of skin sensation: Secondary | ICD-10-CM | POA: Diagnosis not present

## 2015-03-30 DIAGNOSIS — E781 Pure hyperglyceridemia: Secondary | ICD-10-CM | POA: Diagnosis not present

## 2015-03-30 DIAGNOSIS — I10 Essential (primary) hypertension: Secondary | ICD-10-CM

## 2015-03-30 DIAGNOSIS — R200 Anesthesia of skin: Secondary | ICD-10-CM

## 2015-03-30 MED ORDER — LISINOPRIL 20 MG PO TABS: ORAL_TABLET | ORAL | Status: DC

## 2015-03-30 NOTE — Progress Notes (Signed)
CC: Jeremy House is a 54 y.o. male is here for Hypertension and Hyperlipidemia   Subjective: HPI:  Has been checking blood pressure outside of our office and states that his numbers are similar to what we see here today. He's tried to cut back on salt in his diet. He's also try to be more physically active but he admits there is room for improvement with his job taking majority of time of his day.  Denies chest pain shortness of breath orthopnea nor peripheral edema.  Recent lipid panel showed triglycerides were only mildly improved since starting on vascepa.  he admits that he is not watching his saturated fat intake. He occasionally eats fried foods and frequently eats fast food.   denies epigastric pain or right upper quadrant pain.   he also complains of numbness on the right lateral calf that has been present ever since he did a stressful week and remodeling and re-floor in his house. Symptoms are mild in severity. He denies any other motor or sensory disturbances. Symptoms have not gotten better worse since onset and there has been no interventions as of yet. Denies any overlying skin changes   Review Of Systems Outlined In HPI  Past Medical History  Diagnosis Date  . Essential hypertension, benign 07/19/2013    Past Surgical History  Procedure Laterality Date  . Pilonidal cyst excision  1982   Family History  Problem Relation Age of Onset  . Prostate cancer Father   . Colon cancer Maternal Grandfather     History   Social History  . Marital Status: Married    Spouse Name: N/A  . Number of Children: N/A  . Years of Education: N/A   Occupational History  . Not on file.   Social History Main Topics  . Smoking status: Never Smoker   . Smokeless tobacco: Never Used  . Alcohol Use: No  . Drug Use: No  . Sexual Activity: Not on file   Other Topics Concern  . Not on file   Social History Narrative     Objective: BP 138/93 mmHg  Pulse 64  Wt 210 lb (95.255  kg)  General: Alert and Oriented, No Acute Distress HEENT: Pupils equal, round, reactive to light. Conjunctivae clear.  moist mucous membranesgs: Clear to auscultation bilaterally, no wheezing/ronchi/rales.  Comfortable work of breathing. Good air movement. Cardiac: Regular rate and rhythm. Normal S1/S2.  No murmurs, rubs, nor gallops.   Extremities: No peripheral edema.  Strong peripheral pulses.  for range of motion and strength in the right lower extremity.  Mental Status: No depression, anxiety, nor agitation. Skin: Warm and dry.  Assessment & Plan: Jeremy House was seen today for hypertension and hyperlipidemia.  Diagnoses and all orders for this visit:  Essential hypertension, benign Orders: -     lisinopril (PRINIVIL,ZESTRIL) 20 MG tablet; One tablet by mouth daily for blood pressure control.  Hypertriglyceridemia  Numbness in right leg    essential hypertension: Uncontrolled condition start lisinopril return in one month for recheck hypertriglyceridemia: Continue on vascepa, discussed cutting back on saturated fats, fried foods, butter etc. We'll recheck triglycerides in one month before considering fenofibrate addition to his medication regimen. Numbness of the right leg: Discussed my suspicion for a mild impingement on a cutaneous branch of his peroneal nerve, will follow this clinically provided no motor disturbance or further sensory disturbance. He was given a handout for home rehabilitation focusing on the tibiofibular joint. Signs and symptoms requring emergent/urgent reevaluation were discussed with  the patient.  Return in about 4 weeks (around 04/27/2015) for BP and Triglycerides.

## 2015-07-14 ENCOUNTER — Encounter: Payer: Self-pay | Admitting: Family Medicine

## 2015-07-14 ENCOUNTER — Ambulatory Visit (INDEPENDENT_AMBULATORY_CARE_PROVIDER_SITE_OTHER): Payer: BLUE CROSS/BLUE SHIELD | Admitting: Family Medicine

## 2015-07-14 VITALS — BP 158/97 | HR 55 | Wt 216.0 lb

## 2015-07-14 DIAGNOSIS — I10 Essential (primary) hypertension: Secondary | ICD-10-CM | POA: Diagnosis not present

## 2015-07-14 DIAGNOSIS — E781 Pure hyperglyceridemia: Secondary | ICD-10-CM

## 2015-07-14 LAB — LIPID PANEL
Cholesterol: 162 mg/dL (ref 125–200)
HDL: 28 mg/dL — ABNORMAL LOW (ref >=40)
LDL Cholesterol: 91 mg/dL (ref <130)
Total CHOL/HDL Ratio: 5.8 Ratio — ABNORMAL HIGH (ref <=5.0)
Triglycerides: 217 mg/dL — ABNORMAL HIGH (ref <150)
VLDL: 43 mg/dL — ABNORMAL HIGH (ref <30)

## 2015-07-14 LAB — BASIC METABOLIC PANEL
BUN: 14 mg/dL (ref 7–25)
CO2: 27 mmol/L (ref 20–31)
Calcium: 9.1 mg/dL (ref 8.6–10.3)
Chloride: 105 mmol/L (ref 98–110)
Creat: 1 mg/dL (ref 0.70–1.33)
Glucose, Bld: 84 mg/dL (ref 65–99)
Potassium: 4.9 mmol/L (ref 3.5–5.3)
Sodium: 141 mmol/L (ref 135–146)

## 2015-07-14 MED ORDER — LOSARTAN POTASSIUM-HCTZ 100-25 MG PO TABS: 1.0000 | ORAL_TABLET | Freq: Every day | ORAL | Status: DC

## 2015-07-14 NOTE — Progress Notes (Signed)
CC: Jeremy House is a 54 y.o. male is here for Hypertension   Subjective: HPI:  Follow-up essential hypertension: Taking the Cipro on a daily basis without known side effects. He's been checking his blood pressure at home it is consistently in the stage I hypertension range. He denies any chest pain shortness of breath orthopnea nor peripheral edema  Follow-up hypertriglyceridemia: He is having difficulty sticking to a low fat diet. He's having to commute one to 2 hours a day and this is impacting his ability to exercise, he basically doesn't have any time for exercise status. He denies right upper quadrant pain or epigastric discomfort.   Review Of Systems Outlined In HPI  Past Medical History  Diagnosis Date  . Essential hypertension, benign 07/19/2013    Past Surgical History  Procedure Laterality Date  . Pilonidal cyst excision  1982   Family History  Problem Relation Age of Onset  . Prostate cancer Father   . Colon cancer Maternal Grandfather     Social History   Social History  . Marital Status: Married    Spouse Name: N/A  . Number of Children: N/A  . Years of Education: N/A   Occupational History  . Not on file.   Social History Main Topics  . Smoking status: Never Smoker   . Smokeless tobacco: Never Used  . Alcohol Use: No  . Drug Use: No  . Sexual Activity: Not on file   Other Topics Concern  . Not on file   Social History Narrative     Objective: BP 158/97 mmHg  Pulse 55  Wt 216 lb (97.977 kg)  General: Alert and Oriented, No Acute Distress HEENT: Pupils equal, round, reactive to light. Conjunctivae clear.  Moist mucous membranes Lungs: Clear to auscultation bilaterally, no wheezing/ronchi/rales.  Comfortable work of breathing. Good air movement. Cardiac: Regular rate and rhythm. Normal S1/S2.  No murmurs, rubs, nor gallops.  No carotid bruit Abdomen: Normal bowel sounds, soft and non tender without palpable masses. Extremities: No peripheral  edema.  Strong peripheral pulses.  Mental Status: No depression, anxiety, nor agitation. Skin: Warm and dry.  Assessment & Plan: Jeremy House was seen today for hypertension.  Diagnoses and all orders for this visit:  Essential hypertension, benign -     Lipid panel -     Basic Metabolic Panel (BMET) -     losartan-hydrochlorothiazide (HYZAAR) 100-25 MG tablet; Take 1 tablet by mouth daily.  Hypertriglyceridemia -     Lipid panel -     Basic Metabolic Panel (BMET)   Essential hypertension: Uncontrolled chronic condition changing blood pressure medication to Hyzaar. Checking renal function given recent ACE inhibitor Hypertriglyceridemia: Clinically controlled but due for repeat lipid panel.   Return in about 3 months (around 10/14/2015).

## 2015-07-15 ENCOUNTER — Telehealth: Payer: Self-pay | Admitting: Family Medicine

## 2015-07-15 MED ORDER — FENOFIBRATE 145 MG PO TABS: 145.0000 mg | ORAL_TABLET | Freq: Every day | ORAL | Status: DC

## 2015-07-15 NOTE — Telephone Encounter (Signed)
Will you please let patient know that his triglycerides remain in the low 200s with a  Goal of less than 150.  I'd recommend he start an additional triglyceride medication called fenofibrate that I've sent to his [facility]. Blood sugar and kidney function are normal.  F/u in 2 months to repeat this please.

## 2015-07-15 NOTE — Telephone Encounter (Signed)
Awaiting call back.

## 2015-07-16 ENCOUNTER — Encounter: Payer: Self-pay | Admitting: Family Medicine

## 2015-07-16 NOTE — Telephone Encounter (Signed)
Pt advised.

## 2015-07-21 ENCOUNTER — Other Ambulatory Visit: Payer: Self-pay | Admitting: Family Medicine

## 2015-07-23 ENCOUNTER — Telehealth: Payer: Self-pay

## 2015-07-23 NOTE — Telephone Encounter (Signed)
Pt reports that Hyzaar is working well for him.  He stated that BP has been around 120/80 since starting this new medication. Pt would like to come off this medication if BP continues to be at a safe level.

## 2015-07-24 NOTE — Telephone Encounter (Signed)
Pt.notified

## 2015-07-24 NOTE — Telephone Encounter (Signed)
I'd recommend he continue taking this medication daily since it appears to be controlling his blood pressure.

## 2015-08-11 ENCOUNTER — Telehealth: Payer: Self-pay

## 2015-08-11 MED ORDER — VALACYCLOVIR HCL 1 G PO TABS: ORAL_TABLET | ORAL | Status: DC

## 2015-08-11 NOTE — Telephone Encounter (Signed)
Yes vascepa and fenofibrate are intended to be taken. [facility] [address]

## 2015-08-11 NOTE — Telephone Encounter (Signed)
Is Jeremy House suppose to be taking both vacepa and fenofibrate? He was under the impression that he was he to only take fenofibrate.  He also stated that he have cold sore outbreaks from time to time and would like to know can valtrex be Rx'd for him?

## 2015-08-12 NOTE — Telephone Encounter (Signed)
Pt.notified

## 2015-10-02 ENCOUNTER — Telehealth: Payer: Self-pay

## 2015-10-02 MED ORDER — ATOVAQUONE-PROGUANIL HCL 250-100 MG PO TABS: 1.0000 | ORAL_TABLET | Freq: Every day | ORAL | Status: DC

## 2015-10-02 MED ORDER — TYPHOID VACCINE PO CPDR: 1.0000 | DELAYED_RELEASE_CAPSULE | ORAL | Status: DC

## 2015-10-02 NOTE — Telephone Encounter (Signed)
They should consider getting the typhoid vaccine if they have not already received it.  I'll send his Rx to his pharmacy just in case. Also they should consider a malaria prophylaxis pill.  How many days do they plan on being gone?

## 2015-10-02 NOTE — Telephone Encounter (Signed)
I [facility] an [facility]aria [facility], sometimes insurance won't cover this pill so if it's ridiculously expensive don't pick it up and notify me so I can send a different one in. It needs to be started 1-2 days before travel. The typhoid vaccine that I sent is actually a capsule and the 8 day treatment regimen needs to be completed 1 week before travel.    If his wife is Jeremy House can you send her PCP Dr. Jerilynn Mages a request for these medications.

## 2015-10-02 NOTE — Telephone Encounter (Signed)
Pt notified.  Pt stated that they will be there for 7 days and and asked how early should they get those vaccines?  Is it ok that his wife gets the vaccine since she's on levothyroxine?

## 2015-10-02 NOTE — Telephone Encounter (Signed)
Pt and his wife are going to the [employer] in March and was wanting to know should they receive these vaccines (mefloquine and chloroquine)?

## 2015-10-05 NOTE — Telephone Encounter (Signed)
Pt.notified

## 2016-01-09 ENCOUNTER — Other Ambulatory Visit: Payer: Self-pay | Admitting: Family Medicine

## 2016-02-08 ENCOUNTER — Other Ambulatory Visit: Payer: Self-pay | Admitting: Family Medicine

## 2016-02-22 ENCOUNTER — Other Ambulatory Visit: Payer: Self-pay | Admitting: Family Medicine

## 2016-03-14 ENCOUNTER — Encounter: Payer: Self-pay | Admitting: Family Medicine

## 2016-03-14 ENCOUNTER — Ambulatory Visit (INDEPENDENT_AMBULATORY_CARE_PROVIDER_SITE_OTHER): Payer: BLUE CROSS/BLUE SHIELD | Admitting: Family Medicine

## 2016-03-14 VITALS — BP 121/85 | HR 59 | Wt 212.0 lb

## 2016-03-14 DIAGNOSIS — I10 Essential (primary) hypertension: Secondary | ICD-10-CM

## 2016-03-14 DIAGNOSIS — E781 Pure hyperglyceridemia: Secondary | ICD-10-CM

## 2016-03-14 LAB — LIPID PANEL
Cholesterol: 167 mg/dL (ref 125–200)
HDL: 35 mg/dL — ABNORMAL LOW (ref >=40)
LDL Cholesterol: 94 mg/dL (ref <130)
Total CHOL/HDL Ratio: 4.8 Ratio (ref <=5.0)
Triglycerides: 192 mg/dL — ABNORMAL HIGH (ref <150)
VLDL: 38 mg/dL — ABNORMAL HIGH (ref <30)

## 2016-03-14 MED ORDER — FENOFIBRATE 145 MG PO TABS: ORAL_TABLET | ORAL | Status: DC

## 2016-03-14 NOTE — Progress Notes (Signed)
CC: Jeremy House is a 55 y.o. male is here for Follow-up   Subjective: HPI:  Follow-up hypertriglyceridemia: He was under the impression that he did not need to take vascepa along with fenofibrate. Unfortunately this was not my intention, see prior phone note addressing this question. He denies any right upper quadrant pain and has had difficulty finding time for exercising. He is happy to report that he's reduced his sugar intake considerably since I saw him last. He denies any right upper quadrant pain or myalgias. He denies any exertional chest pain, nor any motor or sensory disturbances.  Follow-up essential hypertension: No outside blood pressures to report denies any side effects from Hyzaar. Denies any shortness of breath or irregular heartbeat   Review Of Systems Outlined In HPI  Past Medical History  Diagnosis Date  . Essential hypertension, benign 07/19/2013    Past Surgical History  Procedure Laterality Date  . Pilonidal cyst excision  1982   Family History  Problem Relation Age of Onset  . Prostate cancer Father   . Colon cancer Maternal Grandfather     Social History   Social History  . Marital Status: Married    Spouse Name: N/A  . Number of Children: N/A  . Years of Education: N/A   Occupational History  . Not on file.   Social History Main Topics  . Smoking status: Never Smoker   . Smokeless tobacco: Never Used  . Alcohol Use: No  . Drug Use: No  . Sexual Activity: Not on file   Other Topics Concern  . Not on file   Social History Narrative     Objective: BP 121/85 mmHg  Pulse 59  Wt 212 lb (96.163 kg)  General: Alert and Oriented, No Acute Distress HEENT: Pupils equal, round, reactive to light. Conjunctivae clear.  Moist membranes Lungs: Clear to auscultation bilaterally, no wheezing/ronchi/rales.  Comfortable work of breathing. Good air movement. Cardiac: Regular rate and rhythm. Normal S1/S2.  No murmurs, rubs, nor gallops.   Abdomen:  Normal bowel sounds, soft and non tender without palpable masses. Extremities: No peripheral edema.  Strong peripheral pulses.  Mental Status: No depression, anxiety, nor agitation. Skin: Warm and dry.  Assessment & Plan: Jasun was seen today for follow-up.  Diagnoses and all orders for this visit:  Essential hypertension, benign  Hypertriglyceridemia -     Lipid panel  Other orders -     fenofibrate (TRICOR) 145 MG tablet; TAKE 1 TABLET (145 MG TOTAL) BY MOUTH DAILY.   Essential hypertension: Controlled continue Hyzaar Hypertriglyceridemia: Continue on fenofibrate. Checking triglyceride level today and if it remains elevated restarting vascepa  Return if symptoms worsen or fail to improve.

## 2016-03-16 ENCOUNTER — Telehealth: Payer: Self-pay | Admitting: Family Medicine

## 2016-03-16 MED ORDER — FENOFIBRATE 160 MG PO TABS: ORAL_TABLET | ORAL | Status: DC

## 2016-03-16 NOTE — Telephone Encounter (Signed)
Pt notified of new med changes

## 2016-03-16 NOTE — Telephone Encounter (Signed)
Will you please let patient know that his triglycerides have made a nice improvement and are almost at goal.  I'm going to send in a slightly higher dose of [facility]ate [facility].  I'd recommend we recheck this in 3-6 months.

## 2016-05-18 ENCOUNTER — Telehealth: Payer: Self-pay

## 2016-05-18 MED ORDER — ONDANSETRON HCL 4 MG PO TABS: 4.0000 mg | ORAL_TABLET | Freq: Three times a day (TID) | ORAL | 1 refills | Status: DC | PRN

## 2016-05-18 NOTE — Telephone Encounter (Signed)
Rx faxed

## 2016-05-18 NOTE — Telephone Encounter (Signed)
Try OTC aleve for the cramping or pain and a Rx of zofran for any nausea.  Please offer to fax to whatever pharmacy he might be near. (in your in box)

## 2016-06-09 DIAGNOSIS — Z23 Encounter for immunization: Secondary | ICD-10-CM | POA: Diagnosis not present

## 2016-07-04 ENCOUNTER — Other Ambulatory Visit: Payer: Self-pay | Admitting: *Deleted

## 2016-07-04 DIAGNOSIS — I10 Essential (primary) hypertension: Secondary | ICD-10-CM

## 2016-07-04 MED ORDER — LOSARTAN POTASSIUM-HCTZ 100-25 MG PO TABS: 1.0000 | ORAL_TABLET | Freq: Every day | ORAL | 0 refills | Status: DC

## 2016-07-06 ENCOUNTER — Telehealth: Payer: Self-pay | Admitting: Family Medicine

## 2016-07-06 NOTE — Telephone Encounter (Signed)
I talk to patient and he states he will call back to establish an appointment with Dr. Madilyn Fireman for care since his wife sees Dr.Metheney already and he is aware he will need to this before his meds run out

## 2016-07-31 ENCOUNTER — Other Ambulatory Visit: Payer: Self-pay | Admitting: Family Medicine

## 2016-07-31 DIAGNOSIS — I10 Essential (primary) hypertension: Secondary | ICD-10-CM

## 2016-08-01 ENCOUNTER — Other Ambulatory Visit: Payer: Self-pay

## 2016-08-01 DIAGNOSIS — I10 Essential (primary) hypertension: Secondary | ICD-10-CM

## 2016-08-01 MED ORDER — LOSARTAN POTASSIUM-HCTZ 100-25 MG PO TABS: 1.0000 | ORAL_TABLET | Freq: Every day | ORAL | 1 refills | Status: DC

## 2016-09-04 ENCOUNTER — Other Ambulatory Visit: Payer: Self-pay | Admitting: Family Medicine

## 2016-09-06 ENCOUNTER — Telehealth: Payer: Self-pay | Admitting: Family Medicine

## 2016-09-06 DIAGNOSIS — E781 Pure hyperglyceridemia: Secondary | ICD-10-CM | POA: Diagnosis not present

## 2016-09-06 DIAGNOSIS — I10 Essential (primary) hypertension: Secondary | ICD-10-CM | POA: Diagnosis not present

## 2016-09-06 DIAGNOSIS — Z0000 Encounter for general adult medical examination without abnormal findings: Secondary | ICD-10-CM | POA: Diagnosis not present

## 2016-09-06 NOTE — Telephone Encounter (Signed)
Labs placed and Pt notified.

## 2016-09-06 NOTE — Telephone Encounter (Signed)
Pt was wanting to know if there was a way he can have his blood work ordered before his appt Thursday so he can go ahead and get it done. Thanks

## 2016-09-07 LAB — CBC WITH DIFFERENTIAL/PLATELET
Basophils Absolute: 69 cells/uL (ref 0–200)
Basophils Relative: 1 %
Eosinophils Absolute: 345 cells/uL (ref 15–500)
Eosinophils Relative: 5 %
HCT: 42.5 % (ref 38.5–50.0)
Hemoglobin: 14.3 g/dL (ref 13.2–17.1)
Lymphocytes Relative: 27 %
Lymphs Abs: 1863 cells/uL (ref 850–3900)
MCH: 30.8 pg (ref 27.0–33.0)
MCHC: 33.6 g/dL (ref 32.0–36.0)
MCV: 91.4 fL (ref 80.0–100.0)
MPV: 9.8 fL (ref 7.5–12.5)
Monocytes Absolute: 621 cells/uL (ref 200–950)
Monocytes Relative: 9 %
Neutro Abs: 4002 cells/uL (ref 1500–7800)
Neutrophils Relative %: 58 %
Platelets: 328 10*3/uL (ref 140–400)
RBC: 4.65 MIL/uL (ref 4.20–5.80)
RDW: 13.4 % (ref 11.0–15.0)
WBC: 6.9 10*3/uL (ref 3.8–10.8)

## 2016-09-07 LAB — COMPLETE METABOLIC PANEL WITH GFR
ALT: 23 U/L (ref 9–46)
AST: 18 U/L (ref 10–35)
Albumin: 4.4 g/dL (ref 3.6–5.1)
Alkaline Phosphatase: 31 U/L — ABNORMAL LOW (ref 40–115)
BUN: 26 mg/dL — ABNORMAL HIGH (ref 7–25)
CO2: 24 mmol/L (ref 20–31)
Calcium: 9.2 mg/dL (ref 8.6–10.3)
Chloride: 106 mmol/L (ref 98–110)
Creat: 1.23 mg/dL (ref 0.70–1.33)
GFR, Est African American: 76 mL/min (ref >=60)
GFR, Est Non African American: 66 mL/min (ref >=60)
Glucose, Bld: 77 mg/dL (ref 65–99)
Potassium: 4.5 mmol/L (ref 3.5–5.3)
Sodium: 140 mmol/L (ref 135–146)
Total Bilirubin: 0.4 mg/dL (ref 0.2–1.2)
Total Protein: 7 g/dL (ref 6.1–8.1)

## 2016-09-07 LAB — LIPID PANEL
Cholesterol: 168 mg/dL (ref <200)
HDL: 35 mg/dL — ABNORMAL LOW (ref >40)
LDL Cholesterol: 95 mg/dL (ref <100)
Total CHOL/HDL Ratio: 4.8 Ratio (ref <5.0)
Triglycerides: 191 mg/dL — ABNORMAL HIGH (ref <150)
VLDL: 38 mg/dL — ABNORMAL HIGH (ref <30)

## 2016-09-07 LAB — PSA: PSA: 0.8 ng/mL (ref <=4.0)

## 2016-09-08 ENCOUNTER — Ambulatory Visit (INDEPENDENT_AMBULATORY_CARE_PROVIDER_SITE_OTHER): Payer: BLUE CROSS/BLUE SHIELD | Admitting: Family Medicine

## 2016-09-08 ENCOUNTER — Encounter: Payer: Self-pay | Admitting: Family Medicine

## 2016-09-08 VITALS — BP 99/60 | HR 64 | Ht 71.0 in | Wt 212.0 lb

## 2016-09-08 DIAGNOSIS — I10 Essential (primary) hypertension: Secondary | ICD-10-CM | POA: Diagnosis not present

## 2016-09-08 DIAGNOSIS — E781 Pure hyperglyceridemia: Secondary | ICD-10-CM | POA: Diagnosis not present

## 2016-09-08 MED ORDER — FENOFIBRATE 160 MG PO TABS: 160.0000 mg | ORAL_TABLET | Freq: Every day | ORAL | 1 refills | Status: DC

## 2016-09-08 MED ORDER — LOSARTAN POTASSIUM-HCTZ 100-25 MG PO TABS: 1.0000 | ORAL_TABLET | Freq: Every day | ORAL | 1 refills | Status: DC

## 2016-09-08 NOTE — Progress Notes (Signed)
   Subjective:    Patient ID: Jeremy House, male    DOB: 1961-09-11, 55 y.o.   MRN: [id number]  HPI Hypertension- Pt denies chest pain, SOB, dizziness, or heart palpitations.  Taking meds as directed w/o problems.  Denies medication side effects.    Hypertriglyceridemia-taking fenofibrate daily. Is not had any side effects or problems with the medication. 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 It's a most a 2 Hour drive there   Review of Systems     Objective:   Physical Exam  Constitutional: He is oriented to person, place, and time. He appears well-developed and well-nourished.  HENT:  Head: Normocephalic and atraumatic.  Cardiovascular: Normal rate, regular rhythm and normal heart sounds.   Pulmonary/Chest: Effort normal and breath sounds normal.  Neurological: He is alert and oriented to person, place, and time.  Skin: Skin is warm and dry.  Psychiatric: He has a normal mood and affect. His behavior is normal.          Assessment & Plan:  Hypertension-well controlled.  Blood pressures a little bit low today though he reports that his home blood pressures are typically around 110. We discussed options including reducing his medication but I would not recommend stopping it completely.  Hypertriglyceridemia-continue Benefiber 8 for now. I think until he is in a place where he can start exercising more regularly it's probably important for him to continue the fenofibrate. We can always stop it later if he is able to change his lifestyle.

## 2016-09-22 ENCOUNTER — Other Ambulatory Visit: Payer: Self-pay | Admitting: *Deleted

## 2016-09-22 MED ORDER — VALACYCLOVIR HCL 1 G PO TABS: ORAL_TABLET | ORAL | 11 refills | Status: DC

## 2016-09-30 ENCOUNTER — Telehealth: Payer: Self-pay

## 2016-09-30 NOTE — Telephone Encounter (Signed)
Jeremy House called and reports he was exposed to the Flu. He started with symptoms on Tuesday.  Tuesday and Wednesday - dry cough, chills, sweats and body aches.  Thurday through today - headaches, nausea, diarrhea, sore throat, dry cough and pressure in ears.   I advised him Tamiflu most effective when given within 48 hours. I did advise him he could take Coricidin for the cough and congestion, Imodium for diarrhea and ibuprofen for headaches. Please advise.

## 2016-09-30 NOTE — Telephone Encounter (Signed)
Needs to have a fever to meet criteria for flu.  Has he had one?  We can consider if has been 72 hours but may not be as effective.

## 2016-10-04 MED ORDER — PROMETHAZINE-DM 6.25-15 MG/5ML PO SYRP: 5.0000 mL | ORAL_SOLUTION | Freq: Every evening | ORAL | 0 refills | Status: DC | PRN

## 2016-10-04 NOTE — Telephone Encounter (Signed)
Jeremy House has had symptoms for a week. Today he still has a dry cough during the day, runny nose, feels run down, not sleeping well at night due to dry cough and fullness or pressure/congestion in ears. Denies fever or chills. Is there a different cough medication he could use? He is working out of town and would need medication sent to [place]. It has been entered into [facility].

## 2016-10-04 NOTE — Telephone Encounter (Signed)
Pt lvm stating that he is still having a continued cough and would [facility] [place]

## 2016-10-04 NOTE — Telephone Encounter (Signed)
Prescription sent to pharmacy.   Beatrice Lecher, MD

## 2016-10-05 NOTE — Telephone Encounter (Signed)
Patient advised.

## 2017-03-18 ENCOUNTER — Other Ambulatory Visit: Payer: Self-pay | Admitting: Family Medicine

## 2017-03-18 DIAGNOSIS — I10 Essential (primary) hypertension: Secondary | ICD-10-CM

## 2017-04-06 ENCOUNTER — Other Ambulatory Visit: Payer: Self-pay | Admitting: Family Medicine

## 2017-04-06 DIAGNOSIS — E781 Pure hyperglyceridemia: Secondary | ICD-10-CM

## 2017-04-21 ENCOUNTER — Other Ambulatory Visit: Payer: Self-pay | Admitting: Family Medicine

## 2017-04-21 DIAGNOSIS — I10 Essential (primary) hypertension: Secondary | ICD-10-CM

## 2017-05-07 ENCOUNTER — Other Ambulatory Visit: Payer: Self-pay | Admitting: Family Medicine

## 2017-05-07 DIAGNOSIS — E781 Pure hyperglyceridemia: Secondary | ICD-10-CM

## 2017-06-03 ENCOUNTER — Other Ambulatory Visit: Payer: Self-pay | Admitting: Family Medicine

## 2017-06-03 DIAGNOSIS — I10 Essential (primary) hypertension: Secondary | ICD-10-CM

## 2017-06-05 ENCOUNTER — Telehealth: Payer: Self-pay

## 2017-06-05 NOTE — Telephone Encounter (Signed)
We can given him enough until his appt.

## 2017-06-05 NOTE — Telephone Encounter (Signed)
Pt called and is asking for a refill on losartan, last time you gave him "15 and need appointment for refills".  I transferred him to scheduling, but he is not sure he will have enough until his appointment as he will be traveling out of town with his job for the next couple of weeks.  Please advise.

## 2017-06-06 ENCOUNTER — Other Ambulatory Visit: Payer: Self-pay

## 2017-06-06 DIAGNOSIS — I10 Essential (primary) hypertension: Secondary | ICD-10-CM

## 2017-06-06 MED ORDER — LOSARTAN POTASSIUM-HCTZ 100-25 MG PO TABS: 1.0000 | ORAL_TABLET | Freq: Every day | ORAL | 0 refills | Status: DC

## 2017-06-06 NOTE — Telephone Encounter (Signed)
#  20 sent to pharmacy.  Pt notified

## 2017-06-19 ENCOUNTER — Other Ambulatory Visit: Payer: Self-pay | Admitting: Family Medicine

## 2017-06-19 DIAGNOSIS — I10 Essential (primary) hypertension: Secondary | ICD-10-CM | POA: Diagnosis not present

## 2017-06-19 DIAGNOSIS — Z125 Encounter for screening for malignant neoplasm of prostate: Secondary | ICD-10-CM | POA: Diagnosis not present

## 2017-06-19 DIAGNOSIS — E781 Pure hyperglyceridemia: Secondary | ICD-10-CM

## 2017-06-19 LAB — CBC WITH DIFFERENTIAL/PLATELET
Basophils Absolute: 73 cells/uL (ref 0–200)
Basophils Relative: 1 %
Eosinophils Absolute: 380 cells/uL (ref 15–500)
Eosinophils Relative: 5.2 %
HCT: 42.2 % (ref 38.5–50.0)
Hemoglobin: 14.6 g/dL (ref 13.2–17.1)
Lymphs Abs: 1796 cells/uL (ref 850–3900)
MCH: 30.6 pg (ref 27.0–33.0)
MCHC: 34.6 g/dL (ref 32.0–36.0)
MCV: 88.5 fL (ref 80.0–100.0)
MPV: 9.9 fL (ref 7.5–12.5)
Monocytes Relative: 9 %
Neutro Abs: 4395 cells/uL (ref 1500–7800)
Neutrophils Relative %: 60.2 %
Platelets: 312 10*3/uL (ref 140–400)
RBC: 4.77 10*6/uL (ref 4.20–5.80)
RDW: 13 % (ref 11.0–15.0)
Total Lymphocyte: 24.6 %
WBC mixed population: 657 cells/uL (ref 200–950)
WBC: 7.3 10*3/uL (ref 3.8–10.8)

## 2017-06-19 LAB — COMPLETE METABOLIC PANEL WITH GFR
AG Ratio: 1.5 (calc) (ref 1.0–2.5)
ALT: 28 U/L (ref 9–46)
AST: 18 U/L (ref 10–35)
Albumin: 4.3 g/dL (ref 3.6–5.1)
Alkaline phosphatase (APISO): 47 U/L (ref 40–115)
BUN: 18 mg/dL (ref 7–25)
CO2: 28 mmol/L (ref 20–32)
Calcium: 9.6 mg/dL (ref 8.6–10.3)
Chloride: 105 mmol/L (ref 98–110)
Creat: 1.19 mg/dL (ref 0.70–1.33)
GFR, Est African American: 79 mL/min/{1.73_m2} (ref >=60)
GFR, Est Non African American: 68 mL/min/{1.73_m2} (ref >=60)
Globulin: 2.8 g/dL (calc) (ref 1.9–3.7)
Glucose, Bld: 84 mg/dL (ref 65–99)
Potassium: 4.5 mmol/L (ref 3.5–5.3)
Sodium: 142 mmol/L (ref 135–146)
Total Bilirubin: 0.6 mg/dL (ref 0.2–1.2)
Total Protein: 7.1 g/dL (ref 6.1–8.1)

## 2017-06-19 LAB — LIPID PANEL
Cholesterol: 189 mg/dL (ref <200)
HDL: 32 mg/dL — ABNORMAL LOW (ref >40)
LDL Cholesterol (Calc): 117 mg/dL (calc) — ABNORMAL HIGH
Non-HDL Cholesterol (Calc): 157 mg/dL (calc) — ABNORMAL HIGH (ref <130)
Total CHOL/HDL Ratio: 5.9 (calc) — ABNORMAL HIGH (ref <5.0)
Triglycerides: 280 mg/dL — ABNORMAL HIGH (ref <150)

## 2017-06-19 LAB — PSA: PSA: 0.8 ng/mL (ref <=4.0)

## 2017-06-19 NOTE — Progress Notes (Signed)
Pt called clinic, requested lab recheck prior to appt on Friday with PCP. Orders placed.

## 2017-06-20 ENCOUNTER — Other Ambulatory Visit: Payer: Self-pay

## 2017-06-20 DIAGNOSIS — E781 Pure hyperglyceridemia: Secondary | ICD-10-CM

## 2017-06-20 MED ORDER — FENOFIBRATE 160 MG PO TABS: 160.0000 mg | ORAL_TABLET | Freq: Every day | ORAL | 1 refills | Status: DC

## 2017-06-23 ENCOUNTER — Ambulatory Visit (INDEPENDENT_AMBULATORY_CARE_PROVIDER_SITE_OTHER): Payer: BLUE CROSS/BLUE SHIELD | Admitting: Family Medicine

## 2017-06-23 VITALS — BP 126/88 | HR 74 | Ht 71.0 in | Wt 219.0 lb

## 2017-06-23 DIAGNOSIS — E781 Pure hyperglyceridemia: Secondary | ICD-10-CM

## 2017-06-23 DIAGNOSIS — I10 Essential (primary) hypertension: Secondary | ICD-10-CM | POA: Diagnosis not present

## 2017-06-23 DIAGNOSIS — Z6830 Body mass index (BMI) 30.0-30.9, adult: Secondary | ICD-10-CM | POA: Diagnosis not present

## 2017-06-23 MED ORDER — LOSARTAN POTASSIUM-HCTZ 100-25 MG PO TABS: 1.0000 | ORAL_TABLET | Freq: Every day | ORAL | 3 refills | Status: DC

## 2017-06-23 NOTE — Patient Instructions (Signed)
Plan to recheck BMP and triglycerides in 6 months.

## 2017-06-23 NOTE — Progress Notes (Addendum)
Subjective:    Patient ID: Jeremy House, male    DOB: March 26, 1961, 56 y.o.   MRN: [id number]  HPI Hypertension- Pt denies chest pain, SOB, dizziness, or heart palpitations.  Taking meds as directed w/o problems.  Denies medication side effects.    Hypertriglyceridemia - Recent labs from last week show triglycerides at 280 with an LDL calculated of 117. He has been off the fenofibrate for about 3 weeks.  He was off when he had the blood work done. He really tries to avoid sweet foods and avoid fried foods. He doesn't exercise and is he needs to go wants to know what else he can do to reduce his triglycerides.  Weight gain-he has gained about 7 pounds since he was last year in December.  Review of Systems  BP 126/88   Pulse 74   Ht 5\' 11"  (1.803 m)   Wt 219 lb (99.3 kg)   SpO2 99%   BMI 30.54 kg/m     No Known Allergies  Past Medical History:  Diagnosis Date  . Essential hypertension, benign 07/19/2013    Past Surgical History:  Procedure Laterality Date  . PILONIDAL CYST EXCISION  1982    Social History   Social History  . Marital status: Married    Spouse name: N/A  . Number of children: N/A  . Years of education: N/A   Occupational History  . Not on file.   Social History Main Topics  . Smoking status: Never Smoker  . Smokeless tobacco: Never Used  . Alcohol use No  . Drug use: No  . Sexual activity: Not on file   Other Topics Concern  . Not on file   Social History Narrative  . No narrative on file    Family History  Problem Relation Age of Onset  . Prostate cancer Father   . Colon cancer Maternal Grandfather     Outpatient Encounter Prescriptions as of 06/23/2017  Medication Sig  . valACYclovir (VALTREX) 1000 MG tablet Two tablets every 12 hours for only one day.  Start ASAP after cold sore outbreak.  . [DISCONTINUED] losartan-hydrochlorothiazide (HYZAAR) 100-25 MG tablet Take 1 tablet by mouth daily. APPOINTMENT NEEDED FOR FURTHER REFILLS  .  fenofibrate 160 MG tablet Take 1 tablet (160 mg total) by mouth daily. Due for follow up visit (Patient not taking: Reported on 06/23/2017)  . losartan-hydrochlorothiazide (HYZAAR) 100-25 MG tablet Take 1 tablet by mouth daily.  . [DISCONTINUED] promethazine-dextromethorphan (PROMETHAZINE-DM) 6.25-15 MG/5ML syrup Take 5 mLs by mouth at bedtime as needed for cough.   No facility-administered encounter medications on file as of 06/23/2017.           Objective:   Physical Exam  Constitutional: He is oriented to person, place, and time. He appears well-developed and well-nourished.  HENT:  Head: Normocephalic and atraumatic.  Cardiovascular: Normal rate, regular rhythm and normal heart sounds.   Pulmonary/Chest: Effort normal and breath sounds normal.  Neurological: He is alert and oriented to person, place, and time.  Skin: Skin is warm and dry.  Psychiatric: He has a normal mood and affect. His behavior is normal.        Assessment & Plan:  HTN - Well controlled. Continue current regimen. F/U in 12 months.  Due for BMP in 6 mo.   Hypertriglyceridemia-Continue work on healthy diet and regular exercise to bring this down.Did encourage him to get back into an exercise routine. We discussed some strategies around this. 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 So we discussed going to the gym at least almost 2 days and then try to get some exercise at home on the weekends that would get at least 4 days of exercise in.  BMI 30-weight gain of 7 pounds in the last year. Discussed strategies as above about how to get some exercise in and continuing to work on cutting back on portion sizes. It sounds like he is making some good dietary choices already. I would like him to lose the next 7 pounds by the time I see him back. Follow-up in 6-12 months.

## 2017-06-23 NOTE — Addendum Note (Signed)
Addended by: Beatrice Lecher D on: 06/23/2017 03:42 PM   Modules accepted: Level of Service

## 2017-06-27 ENCOUNTER — Other Ambulatory Visit: Payer: Self-pay

## 2017-06-27 DIAGNOSIS — E781 Pure hyperglyceridemia: Secondary | ICD-10-CM

## 2017-06-27 MED ORDER — FENOFIBRATE 160 MG PO TABS: 160.0000 mg | ORAL_TABLET | Freq: Every day | ORAL | 1 refills | Status: DC

## 2017-06-27 NOTE — Progress Notes (Signed)
Pharmacy did not have Rx, resent.

## 2017-07-24 DIAGNOSIS — H43813 Vitreous degeneration, bilateral: Secondary | ICD-10-CM | POA: Diagnosis not present

## 2017-12-04 ENCOUNTER — Other Ambulatory Visit: Payer: Self-pay | Admitting: Family Medicine

## 2017-12-20 ENCOUNTER — Other Ambulatory Visit: Payer: Self-pay | Admitting: Family Medicine

## 2017-12-20 DIAGNOSIS — E781 Pure hyperglyceridemia: Secondary | ICD-10-CM

## 2018-01-31 DIAGNOSIS — R509 Fever, unspecified: Secondary | ICD-10-CM | POA: Diagnosis not present

## 2018-01-31 DIAGNOSIS — K769 Liver disease, unspecified: Secondary | ICD-10-CM | POA: Diagnosis not present

## 2018-01-31 DIAGNOSIS — R5383 Other fatigue: Secondary | ICD-10-CM | POA: Diagnosis not present

## 2018-01-31 DIAGNOSIS — R932 Abnormal findings on diagnostic imaging of liver and biliary tract: Secondary | ICD-10-CM | POA: Diagnosis not present

## 2018-01-31 DIAGNOSIS — R112 Nausea with vomiting, unspecified: Secondary | ICD-10-CM | POA: Diagnosis not present

## 2018-01-31 DIAGNOSIS — R110 Nausea: Secondary | ICD-10-CM | POA: Diagnosis not present

## 2018-01-31 DIAGNOSIS — R935 Abnormal findings on diagnostic imaging of other abdominal regions, including retroperitoneum: Secondary | ICD-10-CM | POA: Diagnosis not present

## 2018-06-13 ENCOUNTER — Other Ambulatory Visit: Payer: Self-pay | Admitting: Family Medicine

## 2018-06-13 DIAGNOSIS — I10 Essential (primary) hypertension: Secondary | ICD-10-CM

## 2018-07-23 ENCOUNTER — Telehealth: Payer: Self-pay | Admitting: Family Medicine

## 2018-07-23 NOTE — Telephone Encounter (Signed)
Pt is overdue for all labs. Routing to PCP.

## 2018-07-23 NOTE — Telephone Encounter (Addendum)
Pt called. He is overdue for labs  for his Hypertrig condition. Please call him on his cell to let him know..[phone]. Thanks

## 2018-07-23 NOTE — Telephone Encounter (Signed)
It looks like he is coming in for acute issue. Better to wait in case I need to order an additional test and then can get all at one time.

## 2018-07-23 NOTE — Telephone Encounter (Signed)
lvm informing pt that we will address the need for labs tomorrow when he comes in for his appt.Elouise Munroe, [place]

## 2018-07-24 ENCOUNTER — Ambulatory Visit: Payer: BLUE CROSS/BLUE SHIELD | Admitting: Family Medicine

## 2018-07-24 ENCOUNTER — Encounter: Payer: Self-pay | Admitting: Family Medicine

## 2018-07-24 VITALS — BP 114/68 | HR 58 | Ht 70.47 in | Wt 217.0 lb

## 2018-07-24 DIAGNOSIS — I10 Essential (primary) hypertension: Secondary | ICD-10-CM

## 2018-07-24 DIAGNOSIS — E039 Hypothyroidism, unspecified: Secondary | ICD-10-CM | POA: Diagnosis not present

## 2018-07-24 DIAGNOSIS — R1012 Left upper quadrant pain: Secondary | ICD-10-CM

## 2018-07-24 DIAGNOSIS — R7989 Other specified abnormal findings of blood chemistry: Secondary | ICD-10-CM | POA: Diagnosis not present

## 2018-07-24 MED ORDER — LOSARTAN POTASSIUM-HCTZ 100-25 MG PO TABS: 1.0000 | ORAL_TABLET | Freq: Every day | ORAL | 3 refills | Status: DC

## 2018-07-24 NOTE — Progress Notes (Signed)
Subjective:    Patient ID: Jeremy House, male    DOB: 09-17-1960, 57 y.o.   MRN: [id number]  HPI   57 year old male with a history of hypertension comes in today complaining of left upper quadrant pain just below the rib cage for about a week and a half.  Rates his pain overall on a scale of 10 as a 3-4.  He describes it as a dull ache that comes and goes he thinks it might be worse with eating.  He denies any injury or trauma.  He denies feeling short of breath.  He denies any nausea, fever sweats or chills.  He feels a little better this morning but says he also has not eaten.  He says his pain over the last week has not really gotten better but it has not gotten significantly worse either.  Back in May he was actually seen in the emergency department for syncopal episode.  He says he was at work and was getting ready to come home, he does drive 2 hours to get home from [place], when he went into the bathroom to just clean up and wash his hands.  He suddenly felt very lightheaded to the point that he had to lay down on the bathroom floor for about 10 minutes.  He says he then can crawl to his office to call a friend to help him to his car he just continue to get worse.  He had his son-in-law help drive him home and then he started vomiting and passed out.  He ended up in the emergency department and they did significant work-up without any actual findings.  He also reports that his low back has been bothering him more over the last 3 days.  He has had back problems on and off but he did just note that its been bothering him more than usual.  Hypertension- Pt denies chest pain, SOB, dizziness, or heart palpitations.  Taking meds as directed w/o problems.  Denies medication side effects.     CT Abdomen Pelvis W IV Contrast5/22/2019 [facility] Result Impression  IMPRESSION:    No evidence of bowel obstruction. No evidence of free fluid. No evidence of inflammatory stranding.   Low-attenuation  lesions within the liver which may represent hepatic cysts or hemangiomas.   Electronically Signed by: Erline Levine  Result Narrative  CT abdomen and pelvis with contrast:  INDICATION: Nausea and vomiting, fever  TECHNIQUE: Axial scans were performed through the abdomen and pelvis with intravenous contrast. Contrast-80 mL Isovue-370. Radiation dose reduction was utilized (automated exposure control, mA or kV adjustment based on patient size, or iterative image  reconstruction).  COMPARISON:  FINDINGS:   # Lung bases: Normal. # Liver: There are several low-attenuation lesions within the liver which statistically may represent hepatic cysts or hemangiomas.  #  There is no radiopaque gallbladder calculi. #  # Spleen: Normal. # Pancreas: Normal # Adrenal glands: The right adrenal gland is unremarkable. There is a 1.6 x 1.3 cm nodule in the left adrenal gland which may represent an adrenal adenoma. # Kidneys: Normal. # Retroperitoneum: No mass or adenopathy. # Aorta: No significant abnormality. # Bowel and mesentery: No bowel dilatation. No inflammatory changes seen. # Terminal ileum: Normal. # Appendix: Unremarkable   Pelvis: # No mass or adenopathy. # No inflammatory changes seen. # Bladder: No significant abnormality. #   # Bony structures: No significant abnormality.  Other Result Information  Acute Interface, Incoming Rad Results - 01/31/2018  2:38 AM EDT CT abdomen and pelvis with contrast:  INDICATION: Nausea and vomiting, fever  TECHNIQUE: Axial scans were performed through the abdomen and pelvis with intravenous contrast. Contrast-80 mL Isovue-370. Radiation dose reduction was utilized (automated exposure control, mA or kV adjustment based on patient size, or iterative image  reconstruction).  COMPARISON:  FINDINGS:   #  Lung bases: Normal. #  Liver: There are several low-attenuation lesions within the liver which statistically  may  represent hepatic cysts or hemangiomas.  #   There is no radiopaque gallbladder calculi. #   #  Spleen: Normal. #  Pancreas: Normal #  Adrenal glands: The right adrenal gland is unremarkable. There is a 1.6 x 1.3 cm nodule in the left adrenal gland which may represent an adrenal adenoma. #  Kidneys: Normal. #  Retroperitoneum: No mass or adenopathy. #  Aorta: No significant abnormality. #  Bowel and mesentery: No bowel dilatation. No inflammatory changes seen. #  Terminal ileum: Normal. #  Appendix: Unremarkable   Pelvis: #  No mass or adenopathy. #  No inflammatory changes seen. #  Bladder: No significant abnormality. #    #  Bony structures: No significant abnormality.   IMPRESSION:    No evidence of bowel obstruction. No evidence of free fluid. No evidence of inflammatory stranding.   Low-attenuation lesions within the liver which may represent hepatic cysts or hemangiomas.   Electronically Signed by: Erline Levine      Review of Systems  BP 114/68   Pulse (!) 58   Ht 5' 10.47" (1.79 m)   Wt 217 lb (98.4 kg)   SpO2 100%   BMI 30.72 kg/m     No Known Allergies  Past Medical History:  Diagnosis Date  . Essential hypertension, benign 07/19/2013    Past Surgical History:  Procedure Laterality Date  . PILONIDAL CYST EXCISION  1982    Social History   Socioeconomic History  . Marital status: Married    Spouse name: Not on file  . Number of children: Not on file  . Years of education: Not on file  . Highest education level: Not on file  Occupational History  . Not on file  Social Needs  . Financial resource strain: Not on file  . Food insecurity:    Worry: Not on file    Inability: Not on file  . Transportation needs:    Medical: Not on file    Non-medical: Not on file  Tobacco Use  . Smoking status: Never Smoker  . Smokeless tobacco: Never Used  Substance and Sexual Activity  . Alcohol use: No  . Drug use: No  . Sexual activity: Not on  file  Lifestyle  . Physical activity:    Days per week: Not on file    Minutes per session: Not on file  . Stress: Not on file  Relationships  . Social connections:    Talks on phone: Not on file    Gets together: Not on file    Attends religious service: Not on file    Active member of club or organization: Not on file    Attends meetings of clubs or organizations: Not on file    Relationship status: Not on file  . Intimate partner violence:    Fear of current or ex partner: Not on file    Emotionally abused: Not on file    Physically abused: Not on file    Forced sexual activity: Not on file  Other  Topics Concern  . Not on file  Social History Narrative  . Not on file    Family History  Problem Relation Age of Onset  . Prostate cancer Father   . Colon cancer Maternal Grandfather     Outpatient Encounter Medications as of 07/24/2018  Medication Sig  . fenofibrate 160 MG tablet TAKE 1 TABLET BY MOUTH EVERY DAY  . losartan-hydrochlorothiazide (HYZAAR) 100-25 MG tablet Take 1 tablet by mouth daily. PLEASE MAKE AN APPOINTMENT  . [DISCONTINUED] losartan-hydrochlorothiazide (HYZAAR) 100-25 MG tablet Take 1 tablet by mouth daily. PLEASE MAKE AN APPOINTMENT  . [DISCONTINUED] valACYclovir (VALTREX) 1000 MG tablet TAKE TWO TABLETS EVERY 12 HOURS FOR ONLY ONE DAY. START ASAP AFTER COLD SORE OUTBREAK.   No facility-administered encounter medications on file as of 07/24/2018.          Objective:   Physical Exam  Constitutional: He is oriented to person, place, and time. He appears well-developed and well-nourished.  HENT:  Head: Normocephalic and atraumatic.  Cardiovascular: Normal rate, regular rhythm and normal heart sounds.  Pulmonary/Chest: Effort normal and breath sounds normal.  Abdominal: Soft. Bowel sounds are normal. He exhibits no distension and no mass. There is tenderness. There is no rebound and no guarding. No hernia.  Very tender in the left upper quadrant and  left lateral abdomen.  Tender just slightly posterior as well.  Neurological: He is alert and oriented to person, place, and time.  Skin: Skin is warm and dry.  Psychiatric: He has a normal mood and affect. His behavior is normal.       Assessment & Plan:  LUQ pain -unclear etiology.  Suspect diverticulitis versus pancreatitis versus an issue with the spleen.  We will do some lab work to see if his white blood cell count is elevated or if pancreatic enzymes are elevated.  He just had a CT scan done in April some really hesitant to rescan him right now that is a lot of radiation but pending on his lab results we may need to.  For now recommend a liquid diet.  Recommend bowel rest until we get the labs back. Liquid diet.    HTN - Well controlled. Continue current regimen. Follow up in  6 months.

## 2018-07-24 NOTE — Patient Instructions (Addendum)
Recommend bowel rest until we get the labs back. Liquid diet.

## 2018-07-26 LAB — CBC WITH DIFFERENTIAL/PLATELET
Basophils Absolute: 124 cells/uL (ref 0–200)
Basophils Relative: 1.7 %
Eosinophils Absolute: 416 cells/uL (ref 15–500)
Eosinophils Relative: 5.7 %
HCT: 43.2 % (ref 38.5–50.0)
Hemoglobin: 14.6 g/dL (ref 13.2–17.1)
Lymphs Abs: 1518 cells/uL (ref 850–3900)
MCH: 30 pg (ref 27.0–33.0)
MCHC: 33.8 g/dL (ref 32.0–36.0)
MCV: 88.7 fL (ref 80.0–100.0)
MPV: 10.3 fL (ref 7.5–12.5)
Monocytes Relative: 8.7 %
Neutro Abs: 4606 cells/uL (ref 1500–7800)
Neutrophils Relative %: 63.1 %
Platelets: 342 10*3/uL (ref 140–400)
RBC: 4.87 10*6/uL (ref 4.20–5.80)
RDW: 12.7 % (ref 11.0–15.0)
Total Lymphocyte: 20.8 %
WBC mixed population: 635 cells/uL (ref 200–950)
WBC: 7.3 10*3/uL (ref 3.8–10.8)

## 2018-07-26 LAB — COMPLETE METABOLIC PANEL WITH GFR
AG Ratio: 1.8 (calc) (ref 1.0–2.5)
ALT: 29 U/L (ref 9–46)
AST: 21 U/L (ref 10–35)
Albumin: 4.4 g/dL (ref 3.6–5.1)
Alkaline phosphatase (APISO): 33 U/L — ABNORMAL LOW (ref 40–115)
BUN: 19 mg/dL (ref 7–25)
CO2: 28 mmol/L (ref 20–32)
Calcium: 9.8 mg/dL (ref 8.6–10.3)
Chloride: 105 mmol/L (ref 98–110)
Creat: 1.29 mg/dL (ref 0.70–1.33)
GFR, Est African American: 71 mL/min/{1.73_m2} (ref >=60)
GFR, Est Non African American: 61 mL/min/{1.73_m2} (ref >=60)
Globulin: 2.4 g/dL (calc) (ref 1.9–3.7)
Glucose, Bld: 84 mg/dL (ref 65–99)
Potassium: 4.5 mmol/L (ref 3.5–5.3)
Sodium: 142 mmol/L (ref 135–146)
Total Bilirubin: 0.4 mg/dL (ref 0.2–1.2)
Total Protein: 6.8 g/dL (ref 6.1–8.1)

## 2018-07-26 LAB — LIPID PANEL
Cholesterol: 156 mg/dL (ref <200)
HDL: 38 mg/dL — ABNORMAL LOW (ref >40)
LDL Cholesterol (Calc): 91 mg/dL (calc)
Non-HDL Cholesterol (Calc): 118 mg/dL (calc) (ref <130)
Total CHOL/HDL Ratio: 4.1 (calc) (ref <5.0)
Triglycerides: 175 mg/dL — ABNORMAL HIGH (ref <150)

## 2018-07-26 LAB — T4, FREE: Free T4: 1.1 ng/dL (ref 0.8–1.8)

## 2018-07-26 LAB — TSH: TSH: 5.95 mIU/L — ABNORMAL HIGH (ref 0.40–4.50)

## 2018-07-26 LAB — LIPASE: Lipase: 36 U/L (ref 7–60)

## 2018-07-26 LAB — AMYLASE: Amylase: 65 U/L (ref 21–101)

## 2018-08-02 ENCOUNTER — Other Ambulatory Visit: Payer: Self-pay | Admitting: *Deleted

## 2018-08-02 DIAGNOSIS — R899 Unspecified abnormal finding in specimens from other organs, systems and tissues: Secondary | ICD-10-CM

## 2018-09-11 ENCOUNTER — Encounter: Payer: Self-pay | Admitting: *Deleted

## 2018-09-26 DIAGNOSIS — L820 Inflamed seborrheic keratosis: Secondary | ICD-10-CM | POA: Diagnosis not present

## 2018-09-26 DIAGNOSIS — D492 Neoplasm of unspecified behavior of bone, soft tissue, and skin: Secondary | ICD-10-CM | POA: Diagnosis not present

## 2018-09-26 DIAGNOSIS — B078 Other viral warts: Secondary | ICD-10-CM | POA: Diagnosis not present

## 2018-09-26 DIAGNOSIS — L821 Other seborrheic keratosis: Secondary | ICD-10-CM | POA: Diagnosis not present

## 2018-09-26 DIAGNOSIS — L298 Other pruritus: Secondary | ICD-10-CM | POA: Diagnosis not present

## 2018-09-26 DIAGNOSIS — D2339 Other benign neoplasm of skin of other parts of face: Secondary | ICD-10-CM | POA: Diagnosis not present

## 2018-10-18 ENCOUNTER — Other Ambulatory Visit: Payer: Self-pay | Admitting: Family Medicine

## 2018-10-30 ENCOUNTER — Encounter: Payer: Self-pay | Admitting: Internal Medicine

## 2018-12-31 ENCOUNTER — Other Ambulatory Visit: Payer: Self-pay | Admitting: Family Medicine

## 2018-12-31 DIAGNOSIS — E781 Pure hyperglyceridemia: Secondary | ICD-10-CM

## 2019-02-14 ENCOUNTER — Encounter: Payer: Self-pay | Admitting: Internal Medicine

## 2019-02-25 ENCOUNTER — Other Ambulatory Visit: Payer: Self-pay

## 2019-02-25 ENCOUNTER — Ambulatory Visit (AMBULATORY_SURGERY_CENTER): Payer: Self-pay

## 2019-02-25 ENCOUNTER — Encounter: Payer: Self-pay | Admitting: Internal Medicine

## 2019-02-25 VITALS — Ht 70.0 in | Wt 215.0 lb

## 2019-02-25 DIAGNOSIS — Z86010 Personal history of colonic polyps: Secondary | ICD-10-CM

## 2019-02-25 MED ORDER — NA SULFATE-K SULFATE-MG SULF 17.5-3.13-1.6 GM/177ML PO SOLN: 1.0000 | Freq: Once | ORAL | 0 refills | Status: AC

## 2019-02-25 NOTE — Progress Notes (Signed)
Denies allergies to eggs or soy products. Denies complication of anesthesia or sedation. Denies use of weight loss medication. Denies use of O2.   Emmi instructions given for colonoscopy.  Pre-visit was conducted by phone due to Covid 19. Instructions were reviewed with patient and mailed to his confirmed home address. Patient was encouraged to call if he had any questions or concerns regarding instructions.

## 2019-03-01 ENCOUNTER — Other Ambulatory Visit: Payer: Self-pay | Admitting: Internal Medicine

## 2019-03-01 MED ORDER — SUPREP BOWEL PREP KIT 17.5-3.13-1.6 GM/177ML PO SOLN: 1.0000 | Freq: Once | ORAL | 0 refills | Status: AC

## 2019-03-01 NOTE — Telephone Encounter (Signed)
[facility] [address] [facility] [place] [facility] not [facility] for prep soln.

## 2019-03-01 NOTE — Telephone Encounter (Signed)
Spoke with pt. [facility] [place]. The pt will call back if he has problems getting the prep. Gwyndolyn Saxon in [facility]

## 2019-03-04 ENCOUNTER — Telehealth: Payer: Self-pay | Admitting: Internal Medicine

## 2019-03-04 NOTE — Telephone Encounter (Signed)

## 2019-03-05 ENCOUNTER — Ambulatory Visit (AMBULATORY_SURGERY_CENTER): Payer: BC Managed Care – PPO | Admitting: Internal Medicine

## 2019-03-05 ENCOUNTER — Encounter: Payer: Self-pay | Admitting: Internal Medicine

## 2019-03-05 ENCOUNTER — Other Ambulatory Visit: Payer: Self-pay

## 2019-03-05 VITALS — BP 121/91 | HR 53 | Temp 97.8°F | Resp 15 | Ht 70.0 in | Wt 217.0 lb

## 2019-03-05 DIAGNOSIS — Z1211 Encounter for screening for malignant neoplasm of colon: Secondary | ICD-10-CM | POA: Diagnosis not present

## 2019-03-05 DIAGNOSIS — Z86010 Personal history of colonic polyps: Secondary | ICD-10-CM | POA: Diagnosis not present

## 2019-03-05 DIAGNOSIS — D123 Benign neoplasm of transverse colon: Secondary | ICD-10-CM

## 2019-03-05 MED ORDER — SODIUM CHLORIDE 0.9 % IV SOLN: 500.0000 mL | Freq: Once | INTRAVENOUS | Status: DC

## 2019-03-05 NOTE — Op Note (Signed)
[place] Patient Name: Jeremy House Procedure Date: 03/05/2019 2:52 PM MRN: [id number] Endoscopist: Jerene Bears , MD Age: 58 Referring MD:  Date of Birth: Sep 26, 1960 Gender: Male Account #: [id number] Procedure:                Colonoscopy Indications:              High risk colon cancer surveillance: Personal                            history of non-advanced adenoma, Last colonoscopy:                            December 2014 Medicines:                Monitored Anesthesia Care Procedure:                Pre-Anesthesia Assessment:                           - Prior to the procedure, a History and Physical                            was performed, and patient medications and                            allergies were reviewed. The patient's tolerance of                            previous anesthesia was also reviewed. The risks                            and benefits of the procedure and the sedation                            options and risks were discussed with the patient.                            All questions were answered, and informed consent                            was obtained. Prior Anticoagulants: The patient has                            taken no previous anticoagulant or antiplatelet                            agents. ASA Grade Assessment: II - A patient with                            mild systemic disease. After reviewing the risks                            and benefits, the patient was deemed in  satisfactory condition to undergo the procedure.                           After obtaining informed consent, the colonoscope                            was passed under direct vision. Throughout the                            procedure, the patient's blood pressure, pulse, and                            oxygen saturations were monitored continuously. The                            Colonoscope was introduced through the anus and                      advanced to the cecum, identified by appendiceal                            orifice and ileocecal valve. The colonoscopy was                            performed without difficulty. The patient tolerated                            the procedure well. The quality of the bowel                            preparation was good. The ileocecal valve,                            appendiceal orifice, and rectum were photographed. Scope In: 2:58:28 PM Scope Out: 3:11:34 PM Scope Withdrawal Time: 0 hours 11 minutes 2 seconds  Total Procedure Duration: 0 hours 13 minutes 6 seconds  Findings:                 The digital rectal exam was normal.                           A 3 mm polyp was found in the transverse colon. The                            polyp was sessile. The polyp was removed with a                            cold snare. Resection and retrieval were complete.                           The exam was otherwise without abnormality on                            direct and retroflexion views. Complications:  No immediate complications. Estimated Blood Loss:     Estimated blood loss: none. Impression:               - One 3 mm polyp in the transverse colon, removed                            with a cold snare. Resected and retrieved.                           - The examination was otherwise normal on direct                            and retroflexion views. Recommendation:           - Patient has a contact number available for                            emergencies. The signs and symptoms of potential                            delayed complications were discussed with the                            patient. Return to normal activities tomorrow.                            Written discharge instructions were provided to the                            patient.                           - Resume previous diet.                           - Continue present medications.                            - Await pathology results.                           - Repeat colonoscopy is recommended for                            surveillance. The colonoscopy date will be                            determined after pathology results from today's                            exam become available for review. Jerene Bears, MD 03/05/2019 3:13:45 PM This report has been signed electronically.

## 2019-03-05 NOTE — Patient Instructions (Signed)
YOU HAD AN ENDOSCOPIC PROCEDURE TODAY AT [facility]:   Refer to the procedure report that was given to you for any specific questions about what was found during the examination.  If the procedure report does not answer your questions, please call your gastroenterologist to clarify.  If you requested that your care partner not be given the details of your procedure findings, then the procedure report has been included in a sealed envelope for you to review at your convenience later.  YOU SHOULD EXPECT: Some feelings of bloating in the abdomen. Passage of more gas than usual.  Walking can help get rid of the air that was put into your GI tract during the procedure and reduce the bloating. If you had a lower endoscopy (such as a colonoscopy or flexible sigmoidoscopy) you may notice spotting of blood in your stool or on the toilet paper. If you underwent a bowel prep for your procedure, you may not have a normal bowel movement for a few days.  Please Note:  You might notice some irritation and congestion in your nose or some drainage.  This is from the oxygen used during your procedure.  There is no need for concern and it should clear up in a day or so.  SYMPTOMS TO REPORT IMMEDIATELY:   Following lower endoscopy (colonoscopy or flexible sigmoidoscopy):  Excessive amounts of blood in the stool  Significant tenderness or worsening of abdominal pains  Swelling of the abdomen that is new, acute  Fever of 100F or higher   For urgent or emergent issues, a gastroenterologist can be reached at any hour by calling [phone].   DIET:  We do recommend a small meal at first, but then you may proceed to your regular diet.  Drink plenty of fluids but you should avoid alcoholic beverages for 24 hours.  ACTIVITY:  You should plan to take it easy for the rest of today and you should NOT DRIVE or use heavy machinery until tomorrow (because of the sedation medicines used during the test).     FOLLOW UP: Our staff will call the number listed on your records 48-72 hours following your procedure to check on you and address any questions or concerns that you may have regarding the information given to you following your procedure. If we do not reach you, we will leave a message.  We will attempt to reach you two times.  During this call, we will ask if you have developed any symptoms of COVID 19. If you develop any symptoms (ie: fever, flu-like symptoms, shortness of breath, cough etc.) before then, please call [phone].  If you test positive for Covid 19 in the 2 weeks post procedure, please call and report this information to [place].    If any biopsies were taken you will be contacted by phone or by letter within the next 1-3 weeks.  Please call us at [phone] if you have not heard about the biopsies in 3 weeks.    SIGNATURES/CONFIDENTIALITY: You and/or your care partner have signed paperwork which will be entered into your electronic medical record.  These signatures attest to the fact that that the information above on your After Visit Summary has been reviewed and is understood.  Full responsibility of the confidentiality of this discharge information lies with you and/or your care-partner.    Handouts was given to you care on polyps. You may resume your current medications today. Await biopsy results. Please call if any questions or  concerns.

## 2019-03-05 NOTE — Progress Notes (Signed)
No problems noted in the recovery room. 

## 2019-03-05 NOTE — Progress Notes (Signed)
Called to room to assist during endoscopic procedure.  Patient ID and intended procedure confirmed with present staff. Received instructions for my participation in the procedure from the performing physician.  

## 2019-03-05 NOTE — Progress Notes (Signed)
Report given to PACU, vss 

## 2019-03-07 ENCOUNTER — Telehealth: Payer: Self-pay

## 2019-03-07 NOTE — Telephone Encounter (Signed)
  Follow up Call-  Call back number 03/05/2019  Post procedure Call Back phone  # [phone]  Permission to leave phone message Yes  Some recent data might be hidden    1. Have you developed a fever since your procedure? no  2.   Have you had an respiratory symptoms (SOB or cough) since your procedure? no  3.   Have you tested positive for COVID 19 since your procedure no  4.   Have you had any family members/close contacts diagnosed with the COVID 19 since your procedure?  no   If yes to any of these questions please route to Joylene John, RN and Alphonsa Gin, Therapist, sports.  Patient questions:  Do you have a fever, pain , or abdominal swelling? No. Pain Score  0 *  Have you tolerated food without any problems? Yes.    Have you been able to return to your normal activities? Yes.    Do you have any questions about your discharge instructions: Diet   No. Medications  No. Follow up visit  No.  Do you have questions or concerns about your Care? No.  Actions: * If pain score is 4 or above: No action needed, pain <4.

## 2019-03-09 ENCOUNTER — Other Ambulatory Visit: Payer: Self-pay | Admitting: Family Medicine

## 2019-03-11 ENCOUNTER — Encounter: Payer: Self-pay | Admitting: Internal Medicine

## 2019-03-24 ENCOUNTER — Other Ambulatory Visit: Payer: Self-pay | Admitting: Family Medicine

## 2019-03-24 DIAGNOSIS — E781 Pure hyperglyceridemia: Secondary | ICD-10-CM

## 2019-04-21 ENCOUNTER — Other Ambulatory Visit: Payer: Self-pay | Admitting: Family Medicine

## 2019-04-21 DIAGNOSIS — E781 Pure hyperglyceridemia: Secondary | ICD-10-CM

## 2019-04-30 ENCOUNTER — Other Ambulatory Visit: Payer: Self-pay | Admitting: Family Medicine

## 2019-05-08 ENCOUNTER — Other Ambulatory Visit: Payer: Self-pay | Admitting: Family Medicine

## 2019-05-08 DIAGNOSIS — E781 Pure hyperglyceridemia: Secondary | ICD-10-CM

## 2019-05-18 ENCOUNTER — Other Ambulatory Visit: Payer: Self-pay | Admitting: Family Medicine

## 2019-05-18 DIAGNOSIS — E781 Pure hyperglyceridemia: Secondary | ICD-10-CM

## 2019-05-31 ENCOUNTER — Other Ambulatory Visit: Payer: Self-pay | Admitting: Family Medicine

## 2019-05-31 DIAGNOSIS — E781 Pure hyperglyceridemia: Secondary | ICD-10-CM

## 2019-06-04 ENCOUNTER — Other Ambulatory Visit: Payer: Self-pay

## 2019-06-04 ENCOUNTER — Encounter: Payer: Self-pay | Admitting: Family Medicine

## 2019-06-04 ENCOUNTER — Ambulatory Visit: Payer: BC Managed Care – PPO | Admitting: Family Medicine

## 2019-06-04 VITALS — BP 124/81 | HR 73 | Ht 70.0 in | Wt 218.0 lb

## 2019-06-04 DIAGNOSIS — I10 Essential (primary) hypertension: Secondary | ICD-10-CM

## 2019-06-04 DIAGNOSIS — R0681 Apnea, not elsewhere classified: Secondary | ICD-10-CM

## 2019-06-04 DIAGNOSIS — R7989 Other specified abnormal findings of blood chemistry: Secondary | ICD-10-CM

## 2019-06-04 DIAGNOSIS — Z1159 Encounter for screening for other viral diseases: Secondary | ICD-10-CM

## 2019-06-04 DIAGNOSIS — E781 Pure hyperglyceridemia: Secondary | ICD-10-CM | POA: Diagnosis not present

## 2019-06-04 DIAGNOSIS — R0683 Snoring: Secondary | ICD-10-CM | POA: Insufficient documentation

## 2019-06-04 MED ORDER — FENOFIBRATE 160 MG PO TABS: ORAL_TABLET | ORAL | 3 refills | Status: DC

## 2019-06-04 NOTE — Progress Notes (Signed)
Established Patient Office Visit  Subjective:  Patient ID: Jeremy House, male    DOB: [birth year]  Age: 58 y.o. MRN: [id number]  CC:  Chief Complaint  Patient presents with  . Hypertension  . Sleep Apnea    HPI Rahkeem Torbeck Rhue presents for  Hypertension- Pt denies chest pain, SOB, dizziness, or heart palpitations.  Taking meds as directed w/o problems.  Denies medication side effects.    Also discussed the possibility of sleep apnea.  He says he promised his wife that he would talk to me about it.  He has been snoring for years.  Mostly it is when he is on his back but more recently his wife had noticed that his breathing seems to be altered at night while he sleeping and she has witnessed some apneas.  He does have a wide neck circumference and is over age 89 now.  He has never been diagnosed or been tested before.  Past Medical History:  Diagnosis Date  . Essential hypertension, benign 07/19/2013    Past Surgical History:  Procedure Laterality Date  . PILONIDAL CYST EXCISION  1982    Family History  Problem Relation Age of Onset  . Prostate cancer Father   . Colon cancer Maternal Grandfather   . Esophageal cancer Neg Hx   . Rectal cancer Neg Hx   . Stomach cancer Neg Hx     Social History   Socioeconomic History  . Marital status: Married    Spouse name: Not on file  . Number of children: Not on file  . Years of education: Not on file  . Highest education level: Not on file  Occupational History  . Not on file  Social Needs  . Financial resource strain: Not on file  . Food insecurity    Worry: Not on file    Inability: Not on file  . Transportation needs    Medical: Not on file    Non-medical: Not on file  Tobacco Use  . Smoking status: Never Smoker  . Smokeless tobacco: Never Used  Substance and Sexual Activity  . Alcohol use: No  . Drug use: No  . Sexual activity: Not on file  Lifestyle  . Physical activity    Days per week: Not on file    Minutes  per session: Not on file  . Stress: Not on file  Relationships  . Social Herbalist on phone: Not on file    Gets together: Not on file    Attends religious service: Not on file    Active member of club or organization: Not on file    Attends meetings of clubs or organizations: Not on file    Relationship status: Not on file  . Intimate partner violence    Fear of current or ex partner: Not on file    Emotionally abused: Not on file    Physically abused: Not on file    Forced sexual activity: Not on file  Other Topics Concern  . Not on file  Social History Narrative  . Not on file     No Known Allergies  ROS Review of Systems    Objective:    Physical Exam  Constitutional: He is oriented to person, place, and time. He appears well-developed and well-nourished.  HENT:  Head: Normocephalic and atraumatic.  Cardiovascular: Normal rate, regular rhythm and normal heart sounds.  Pulmonary/Chest: Effort normal and breath sounds normal.  Neurological: He is alert and oriented  to person, place, and time.  Skin: Skin is warm and dry.  Psychiatric: He has a normal mood and affect. His behavior is normal.    BP 124/81   Pulse 73   Ht 5\' 10"  (1.778 m)   Wt 218 lb (98.9 kg)   SpO2 97%   BMI 31.28 kg/m  Wt Readings from Last 3 Encounters:  06/04/19 218 lb (98.9 kg)  03/05/19 217 lb (98.4 kg)  02/25/19 215 lb (97.5 kg)     Health Maintenance Due  Topic Date Due  . Hepatitis C Screening  03-08-1961  . HIV Screening  12/23/1975    There are no preventive care reminders to display for this patient.  Lab Results  Component Value Date   TSH 5.95 (H) 07/24/2018   Lab Results  Component Value Date   WBC 7.3 07/24/2018   HGB 14.6 07/24/2018   HCT 43.2 07/24/2018   MCV 88.7 07/24/2018   PLT 342 07/24/2018   Lab Results  Component Value Date   NA 142 07/24/2018   K 4.5 07/24/2018   CO2 28 07/24/2018   GLUCOSE 84 07/24/2018   BUN 19 07/24/2018    CREATININE 1.29 07/24/2018   BILITOT 0.4 07/24/2018   ALKPHOS 31 (L) 09/06/2016   AST 21 07/24/2018   ALT 29 07/24/2018   PROT 6.8 07/24/2018   ALBUMIN 4.4 09/06/2016   CALCIUM 9.8 07/24/2018   Lab Results  Component Value Date   CHOL 156 07/24/2018   Lab Results  Component Value Date   HDL 38 (L) 07/24/2018   Lab Results  Component Value Date   LDLCALC 91 07/24/2018   Lab Results  Component Value Date   TRIG 175 (H) 07/24/2018   Lab Results  Component Value Date   CHOLHDL 4.1 07/24/2018   No results found for: HGBA1C    Assessment & Plan:   Problem List Items Addressed This Visit      Cardiovascular and Mediastinum   Essential hypertension, benign - Primary (Chronic)    Well controlled. Continue current regimen. Follow up in  6 mo      Relevant Medications   fenofibrate 160 MG tablet   Other Relevant Orders   COMPLETE METABOLIC PANEL WITH GFR   Lipid panel     Other   Snoring    STOP BANG Score of 7 today. Will refer for home Sleep study.        Relevant Orders   Home sleep test   Hypertriglyceridemia (Chronic)    Not currently on prescription treatment.  Due to recheck lipids.      Relevant Medications   fenofibrate 160 MG tablet   Other Relevant Orders   COMPLETE METABOLIC PANEL WITH GFR   Lipid panel    Other Visit Diagnoses    Abnormal TSH       Relevant Orders   TSH   Encounter for hepatitis C screening test for low risk patient       Relevant Orders   Hepatitis C Antibody   Apnea       Relevant Orders   Home sleep test      Apnea - STOP BANG Score of 7 today. Will refer for home Sleep study.    Meds ordered this encounter  Medications  . fenofibrate 160 MG tablet    Sig: TAKE 1 TABLET BY MOUTH DAILY.    Dispense:  90 tablet    Refill:  3    Follow-up: Return in about 6 months (  around 12/02/2019).    Beatrice Lecher, MD

## 2019-06-04 NOTE — Assessment & Plan Note (Signed)
Not currently on prescription treatment.  Due to recheck lipids.

## 2019-06-04 NOTE — Patient Instructions (Signed)
Will need labs in November.

## 2019-06-04 NOTE — Assessment & Plan Note (Signed)
Well controlled. Continue current regimen. Follow up in  6 mo  

## 2019-06-04 NOTE — Assessment & Plan Note (Signed)
STOP BANG Score of 7 today. Will refer for home Sleep study.

## 2019-08-22 ENCOUNTER — Other Ambulatory Visit: Payer: Self-pay | Admitting: Family Medicine

## 2019-09-11 ENCOUNTER — Other Ambulatory Visit: Payer: Self-pay | Admitting: *Deleted

## 2019-09-12 LAB — LIPID PANEL
Cholesterol: 169 mg/dL (ref <200)
HDL: 37 mg/dL — ABNORMAL LOW (ref >=40)
LDL Cholesterol (Calc): 101 mg/dL (calc) — ABNORMAL HIGH
Non-HDL Cholesterol (Calc): 132 mg/dL (calc) — ABNORMAL HIGH (ref <130)
Total CHOL/HDL Ratio: 4.6 (calc) (ref <5.0)
Triglycerides: 185 mg/dL — ABNORMAL HIGH (ref <150)

## 2019-09-12 LAB — COMPLETE METABOLIC PANEL WITH GFR
AG Ratio: 1.8 (calc) (ref 1.0–2.5)
ALT: 24 U/L (ref 9–46)
AST: 21 U/L (ref 10–35)
Albumin: 4.6 g/dL (ref 3.6–5.1)
Alkaline phosphatase (APISO): 35 U/L (ref 35–144)
BUN: 20 mg/dL (ref 7–25)
CO2: 25 mmol/L (ref 20–32)
Calcium: 9.5 mg/dL (ref 8.6–10.3)
Chloride: 108 mmol/L (ref 98–110)
Creat: 1.24 mg/dL (ref 0.70–1.33)
GFR, Est African American: 74 mL/min/{1.73_m2} (ref >=60)
GFR, Est Non African American: 64 mL/min/{1.73_m2} (ref >=60)
Globulin: 2.5 g/dL (calc) (ref 1.9–3.7)
Glucose, Bld: 82 mg/dL (ref 65–99)
Potassium: 4.7 mmol/L (ref 3.5–5.3)
Sodium: 143 mmol/L (ref 135–146)
Total Bilirubin: 0.5 mg/dL (ref 0.2–1.2)
Total Protein: 7.1 g/dL (ref 6.1–8.1)

## 2019-09-12 LAB — PSA: PSA: 0.8 ng/mL (ref <=4.0)

## 2019-09-12 LAB — HEPATITIS C ANTIBODY
Hepatitis C Ab: NONREACTIVE
SIGNAL TO CUT-OFF: 0.01 (ref <1.00)

## 2019-09-12 LAB — TSH: TSH: 5.44 mIU/L — ABNORMAL HIGH (ref 0.40–4.50)

## 2019-09-12 LAB — T4, FREE: Free T4: 1 ng/dL (ref 0.8–1.8)

## 2019-10-15 ENCOUNTER — Ambulatory Visit: Payer: 59 | Admitting: Family Medicine

## 2019-10-15 ENCOUNTER — Telehealth: Payer: Self-pay | Admitting: Family Medicine

## 2019-10-15 ENCOUNTER — Other Ambulatory Visit: Payer: Self-pay

## 2019-10-15 ENCOUNTER — Encounter: Payer: Self-pay | Admitting: Family Medicine

## 2019-10-15 VITALS — BP 124/80 | HR 69 | Ht 70.0 in | Wt 222.0 lb

## 2019-10-15 DIAGNOSIS — G43909 Migraine, unspecified, not intractable, without status migrainosus: Secondary | ICD-10-CM | POA: Insufficient documentation

## 2019-10-15 DIAGNOSIS — G43809 Other migraine, not intractable, without status migrainosus: Secondary | ICD-10-CM

## 2019-10-15 DIAGNOSIS — R0683 Snoring: Secondary | ICD-10-CM

## 2019-10-15 DIAGNOSIS — G4440 Drug-induced headache, not elsewhere classified, not intractable: Secondary | ICD-10-CM | POA: Diagnosis not present

## 2019-10-15 DIAGNOSIS — R351 Nocturia: Secondary | ICD-10-CM | POA: Diagnosis not present

## 2019-10-15 DIAGNOSIS — N401 Enlarged prostate with lower urinary tract symptoms: Secondary | ICD-10-CM

## 2019-10-15 DIAGNOSIS — I10 Essential (primary) hypertension: Secondary | ICD-10-CM

## 2019-10-15 LAB — POCT URINALYSIS DIP (CLINITEK)
Bilirubin, UA: NEGATIVE
Blood, UA: NEGATIVE
Glucose, UA: NEGATIVE mg/dL
Ketones, POC UA: NEGATIVE mg/dL
Leukocytes, UA: NEGATIVE
Nitrite, UA: NEGATIVE
POC PROTEIN,UA: NEGATIVE
Spec Grav, UA: >=1.03 — AB (ref 1.010–1.025)
Urobilinogen, UA: 0.2 E.U./dL
pH, UA: 5.5 (ref 5.0–8.0)

## 2019-10-15 MED ORDER — PREDNISONE 10 MG PO TABS: ORAL_TABLET | ORAL | 0 refills | Status: DC

## 2019-10-15 MED ORDER — TAMSULOSIN HCL 0.4 MG PO CAPS: 0.4000 mg | ORAL_CAPSULE | Freq: Every day | ORAL | 3 refills | Status: DC

## 2019-10-15 NOTE — Telephone Encounter (Signed)
Can you call pt with the CPT code for home sleep study so he can check with his insurance on coverage.

## 2019-10-15 NOTE — Progress Notes (Signed)
Pt reports that this has been ongoing for several months. Denies any pain,blood in urine. No excessive thirst he said that he has actually cut back on the amount of fluids he drinks so he doesn't have to get up so often.  He also  He also c/o daily headaches and uses Excedrine for this. He said that the headaches are primarily in the bridge of his nose.

## 2019-10-15 NOTE — Telephone Encounter (Signed)
Called patient and gave him home sleep study code, [phone].  He will call back after he calls his insurance co.

## 2019-10-15 NOTE — Progress Notes (Signed)
Acute Office Visit  Subjective:    Patient ID: Jeremy House, male    DOB: 12/09/1960, 59 y.o.   MRN: [id number]  Chief Complaint  Patient presents with  . Nocturia  . Headache    HPI Patient is in today for increased urination at night for about 3 months.   No blood in the urine or dysuria.  No inc thirst.  He says he is getting up on average about twice a night and it seems to be really a most like clockwork.  He does have to commute for work in the mornings hence a waking up tired has been a little frustrating.  Has never had his prostate checked and has never had any prior issues that he is aware of.  He has not noticed any urgency during the daytime.  No difficulty starting and stopping urination.  Last PSA was about a month ago and it was normal.  AUA score of 8 consistent with moderate symptoms.  He has been having more frequent HA for several months and using excedrin and more recently pain in the RUQ.  In fact his headaches have been frequent enough that he has been using medication daily.  Most days the headache starts around midday but some days he actually wakes up with it.  In fact we had talked previously about getting him tested for sleep apnea as he does snore.  He also reports of history of more severe headaches years ago where he would have to sit in a dark room and if he could and get the holiday to calm down he would usually vomit.  He denies any recent neck pain.  Snoring-he did check into getting a sleep study.  If it is a hospital-based facility then he has to pay 30% of its freestanding that it is less expensive.  We can also look into getting him scheduled for a home sleep study.  Past Medical History:  Diagnosis Date  . Essential hypertension, benign 07/19/2013    Past Surgical History:  Procedure Laterality Date  . PILONIDAL CYST EXCISION  1982    Family History  Problem Relation Age of Onset  . Prostate cancer Father   . Colon cancer Maternal Grandfather    . Esophageal cancer Neg Hx   . Rectal cancer Neg Hx   . Stomach cancer Neg Hx     Social History   Socioeconomic History  . Marital status: Married    Spouse name: Not on file  . Number of children: Not on file  . Years of education: Not on file  . Highest education level: Not on file  Occupational History  . Not on file  Tobacco Use  . Smoking status: Never Smoker  . Smokeless tobacco: Never Used  Substance and Sexual Activity  . Alcohol use: No  . Drug use: No  . Sexual activity: Not on file  Other Topics Concern  . Not on file  Social History Narrative  . Not on file   Social Determinants of Health   Financial Resource Strain:   . Difficulty of Paying Living Expenses: Not on file  Food Insecurity:   . Worried About Charity fundraiser in the Last Year: Not on file  . Ran Out of Food in the Last Year: Not on file  Transportation Needs:   . Lack of Transportation (Medical): Not on file  . Lack of Transportation (Non-Medical): Not on file  Physical Activity:   . Days of Exercise  per Week: Not on file  . Minutes of Exercise per Session: Not on file  Stress:   . Feeling of Stress : Not on file  Social Connections:   . Frequency of Communication with Friends and Family: Not on file  . Frequency of Social Gatherings with Friends and Family: Not on file  . Attends Religious Services: Not on file  . Active Member of Clubs or Organizations: Not on file  . Attends Archivist Meetings: Not on file  . Marital Status: Not on file  Intimate Partner Violence:   . Fear of Current or Ex-Partner: Not on file  . Emotionally Abused: Not on file  . Physically Abused: Not on file  . Sexually Abused: Not on file    Outpatient Medications Prior to Visit  Medication Sig Dispense Refill  . fenofibrate 160 MG tablet TAKE 1 TABLET BY MOUTH DAILY. 90 tablet 3  . losartan (COZAAR) 100 MG tablet TAKE 1 TABLET BY MOUTH EVERY DAY 90 tablet 0  . Magnesium 250 MG TABS Take 1  tablet by mouth daily.    . Multiple Vitamins-Minerals (MULTIVITAMIN MEN PO) Take 1 tablet by mouth daily.     No facility-administered medications prior to visit.    No Known Allergies  Review of Systems     Objective:    Physical Exam Vitals reviewed.  Constitutional:      Appearance: He is well-developed.  HENT:     Head: Normocephalic and atraumatic.  Eyes:     Conjunctiva/sclera: Conjunctivae normal.  Cardiovascular:     Rate and Rhythm: Normal rate.  Pulmonary:     Effort: Pulmonary effort is normal.  Genitourinary:    Rectum: Normal. Guaiac result negative.     Comments: 2+ enlarged Prostate, symmetric. No nodules.  Skin:    General: Skin is dry.     Coloration: Skin is not pale.  Neurological:     Mental Status: He is alert and oriented to person, place, and time.     Cranial Nerves: No cranial nerve deficit, dysarthria or facial asymmetry.     Gait: Gait normal.  Psychiatric:        Behavior: Behavior normal.     BP 124/80   Pulse 69   Ht 5\' 10"  (1.778 m)   Wt 222 lb (100.7 kg)   SpO2 100%   BMI 31.85 kg/m  Wt Readings from Last 3 Encounters:  10/15/19 222 lb (100.7 kg)  06/04/19 218 lb (98.9 kg)  03/05/19 217 lb (98.4 kg)    Health Maintenance Due  Topic Date Due  . HIV Screening  12/23/1975    There are no preventive care reminders to display for this patient.   Lab Results  Component Value Date   TSH 5.44 (H) 09/10/2019   Lab Results  Component Value Date   WBC 7.3 07/24/2018   HGB 14.6 07/24/2018   HCT 43.2 07/24/2018   MCV 88.7 07/24/2018   PLT 342 07/24/2018   Lab Results  Component Value Date   NA 143 09/10/2019   K 4.7 09/10/2019   CO2 25 09/10/2019   GLUCOSE 82 09/10/2019   BUN 20 09/10/2019   CREATININE 1.24 09/10/2019   BILITOT 0.5 09/10/2019   ALKPHOS 31 (L) 09/06/2016   AST 21 09/10/2019   ALT 24 09/10/2019   PROT 7.1 09/10/2019   ALBUMIN 4.4 09/06/2016   CALCIUM 9.5 09/10/2019   Lab Results  Component  Value Date   CHOL 169 09/10/2019  Lab Results  Component Value Date   HDL 37 (L) 09/10/2019   Lab Results  Component Value Date   LDLCALC 101 (H) 09/10/2019   Lab Results  Component Value Date   TRIG 185 (H) 09/10/2019   Lab Results  Component Value Date   CHOLHDL 4.6 09/10/2019   No results found for: HGBA1C     Assessment & Plan:   Problem List Items Addressed This Visit      Cardiovascular and Mediastinum   Migraine    Sounds like based on his history he gets migraines with aura.  Though his more recent headaches have not been severe with light sensitivity or vomiting.  I do feel like he is in a cycle was taking medication daily.  Most consistent with medication overuse headache.  We discussed the importance of stopping the over-the-counter medications and will do a 5-day prednisone taper to see if we can break the cycle.  If the headaches recur then please let me know.      Essential hypertension, benign (Chronic)    PM looks great today.        Other   Snoring    Try to get him the CPT code for home sleep study to see if it is covered with his insurance. Could be contributing to his HAs as well.       Benign prostatic hyperplasia with nocturia    New diagnosis.  Exam consistent with benign prostatic fracture atrophy with nocturia based on current symptoms.  We discussed trial of tamsulosin.  Plan to follow-up in 8 weeks to see how well he is doing on it.  Warned about potential side effects.      Relevant Medications   tamsulosin (FLOMAX) 0.4 MG CAPS capsule    Other Visit Diagnoses    Nocturia    -  Primary   Relevant Orders   POCT URINALYSIS DIP (CLINITEK) (Completed)   Medication overuse headache       Relevant Medications   predniSONE (DELTASONE) 10 MG tablet     Medication overuse headache-gust prednisone taper and discontinuing daily use of Excedrin.  We will try to break the headache cycle but may need to consider starting additional medication to  reduce frequency of headaches.  It does sound like he has an underlying history of migraines as well.  [place] Name 10/15/19 0900         During the last Month   Sensation of Bladder not Empty  Less than 1 time in 5     Urinate<2 hours after last  About half the time     Mult. stop/start when voiding  Not at all     Difficult to postpone voiding  Less than 1 time in 5     Weak urinary stream  Less than 1 time in 5     Push/strain to begin urination  Not at all     Times per night up to urinate  Less than half the time       OTHER   Total Score  8         Meds ordered this encounter  Medications  . tamsulosin (FLOMAX) 0.4 MG CAPS capsule    Sig: Take 1 capsule (0.4 mg total) by mouth daily.    Dispense:  30 capsule    Refill:  3  . predniSONE (DELTASONE) 10 MG tablet    Sig: 80mg  PO Day 1, 60mg  Day 2, 40mg  Day  3, 20 mg Day 4, 10 mg Day 5    Dispense:  21 tablet    Refill:  0     Beatrice Lecher, MD

## 2019-10-15 NOTE — Assessment & Plan Note (Signed)
New diagnosis.  Exam consistent with benign prostatic fracture atrophy with nocturia based on current symptoms.  We discussed trial of tamsulosin.  Plan to follow-up in 8 weeks to see how well he is doing on it.  Warned about potential side effects.

## 2019-10-15 NOTE — Assessment & Plan Note (Signed)
PM looks great today.

## 2019-10-15 NOTE — Assessment & Plan Note (Addendum)
Try to get him the CPT code for home sleep study to see if it is covered with his insurance. Could be contributing to his HAs as well.

## 2019-10-15 NOTE — Assessment & Plan Note (Signed)
Sounds like based on his history he gets migraines with aura.  Though his more recent headaches have not been severe with light sensitivity or vomiting.  I do feel like he is in a cycle was taking medication daily.  Most consistent with medication overuse headache.  We discussed the importance of stopping the over-the-counter medications and will do a 5-day prednisone taper to see if we can break the cycle.  If the headaches recur then please let me know.

## 2019-11-15 ENCOUNTER — Other Ambulatory Visit: Payer: Self-pay | Admitting: Family Medicine

## 2020-01-06 ENCOUNTER — Other Ambulatory Visit: Payer: Self-pay | Admitting: Family Medicine

## 2020-01-06 DIAGNOSIS — N401 Enlarged prostate with lower urinary tract symptoms: Secondary | ICD-10-CM

## 2020-05-16 ENCOUNTER — Other Ambulatory Visit: Payer: Self-pay | Admitting: Family Medicine

## 2020-05-18 ENCOUNTER — Other Ambulatory Visit: Payer: Self-pay | Admitting: Family Medicine

## 2020-06-11 ENCOUNTER — Other Ambulatory Visit: Payer: Self-pay | Admitting: Family Medicine

## 2020-06-11 DIAGNOSIS — E781 Pure hyperglyceridemia: Secondary | ICD-10-CM

## 2020-07-06 ENCOUNTER — Other Ambulatory Visit: Payer: Self-pay

## 2020-07-06 DIAGNOSIS — I10 Essential (primary) hypertension: Secondary | ICD-10-CM

## 2020-07-06 DIAGNOSIS — R7989 Other specified abnormal findings of blood chemistry: Secondary | ICD-10-CM

## 2020-07-06 NOTE — Progress Notes (Signed)
Ordered labs per result note.  °

## 2020-07-07 ENCOUNTER — Other Ambulatory Visit: Payer: Self-pay | Admitting: *Deleted

## 2020-07-07 DIAGNOSIS — R899 Unspecified abnormal finding in specimens from other organs, systems and tissues: Secondary | ICD-10-CM

## 2020-07-07 LAB — COMPLETE METABOLIC PANEL WITH GFR
AG Ratio: 1.6 (calc) (ref 1.0–2.5)
ALT: 29 U/L (ref 9–46)
AST: 23 U/L (ref 10–35)
Albumin: 4.4 g/dL (ref 3.6–5.1)
Alkaline phosphatase (APISO): 43 U/L (ref 35–144)
BUN/Creatinine Ratio: 10 (calc) (ref 6–22)
BUN: 14 mg/dL (ref 7–25)
CO2: 28 mmol/L (ref 20–32)
Calcium: 9.9 mg/dL (ref 8.6–10.3)
Chloride: 103 mmol/L (ref 98–110)
Creat: 1.37 mg/dL — ABNORMAL HIGH (ref 0.70–1.33)
GFR, Est African American: 65 mL/min/{1.73_m2} (ref >=60)
GFR, Est Non African American: 56 mL/min/{1.73_m2} — ABNORMAL LOW (ref >=60)
Globulin: 2.7 g/dL (calc) (ref 1.9–3.7)
Glucose, Bld: 75 mg/dL (ref 65–99)
Potassium: 5 mmol/L (ref 3.5–5.3)
Sodium: 141 mmol/L (ref 135–146)
Total Bilirubin: 0.4 mg/dL (ref 0.2–1.2)
Total Protein: 7.1 g/dL (ref 6.1–8.1)

## 2020-07-07 LAB — TSH: TSH: 4 mIU/L (ref 0.40–4.50)

## 2020-09-09 ENCOUNTER — Other Ambulatory Visit: Payer: Self-pay | Admitting: Family Medicine

## 2020-09-09 DIAGNOSIS — E781 Pure hyperglyceridemia: Secondary | ICD-10-CM

## 2020-09-17 ENCOUNTER — Other Ambulatory Visit: Payer: Self-pay

## 2020-09-17 ENCOUNTER — Ambulatory Visit (INDEPENDENT_AMBULATORY_CARE_PROVIDER_SITE_OTHER): Payer: 59

## 2020-09-17 ENCOUNTER — Telehealth (INDEPENDENT_AMBULATORY_CARE_PROVIDER_SITE_OTHER): Payer: 59 | Admitting: Family Medicine

## 2020-09-17 ENCOUNTER — Encounter: Payer: Self-pay | Admitting: Family Medicine

## 2020-09-17 VITALS — HR 65

## 2020-09-17 DIAGNOSIS — U071 COVID-19: Secondary | ICD-10-CM | POA: Diagnosis not present

## 2020-09-17 DIAGNOSIS — R059 Cough, unspecified: Secondary | ICD-10-CM | POA: Diagnosis not present

## 2020-09-17 DIAGNOSIS — R0602 Shortness of breath: Secondary | ICD-10-CM

## 2020-09-17 MED ORDER — AMOXICILLIN-POT CLAVULANATE 875-125 MG PO TABS: 1.0000 | ORAL_TABLET | Freq: Two times a day (BID) | ORAL | 0 refills | Status: DC

## 2020-09-17 NOTE — Progress Notes (Signed)
Virtual Visit via Video Note  I connected with Jeremy House on 09/17/20 at 10:10 AM EST by a video enabled telemedicine application and verified that I am speaking with the correct person using two identifiers.   I discussed the limitations of evaluation and management by telemedicine and the availability of in person appointments. The patient expressed understanding and agreed to proceed.  Patient location: at home  Provider location: in office  Subjective:    CC: COVID 19   HPI: Pt reports that his sxs started on or around 09/09/2020 ( 8 days) . He did have a fever of 101 at the onset of his sxs, denies any fever now. He took a rapid test on 09/12/2020 and he and his wife tested positive. Extreme weakness.  He reports headache, head congestion,bodyaches,head congestion,non productive cough,nausea(x3 days),extreme fatigue. SOB with activity. No palpitations. Chest has some soreness and tightness. No diarrhea.  Drinking lots of fluids.  Dec hearing in both ears from fullness.   He has been taking dayquil,nyquil and B12,D3, and Vitamin C.    Past medical history, Surgical history, Family history not pertinant except as noted below, Social history, Allergies, and medications have been entered into the medical record, reviewed, and corrections made.   Review of Systems: No fevers, chills, night sweats, weight loss, chest pain, or shortness of breath.   Objective:    General: Speaking clearly in complete sentences without any shortness of breath.  Alert and oriented x3.  Normal judgment. No apparent acute distress.    Impression and Recommendations:    No problem-specific Assessment & Plan notes found for this encounter.  COVID -19/SOB and extreme fatigue - he has been vaccinated. Discussed getting chest xray since pulse ox a little low but not dangerous and feeling SOB with activity and fatigued. Some CP with cough. I think Call if not better in one week. Recommend continue symptomatic  care.  Called in augmentin for sinusitis if not improving.     Time spent in encounter 20 minutes  I discussed the assessment and treatment plan with the patient. The patient was provided an opportunity to ask questions and all were answered. The patient agreed with the plan and demonstrated an understanding of the instructions.   The patient was advised to call back or seek an in-person evaluation if the symptoms worsen or if the condition fails to improve as anticipated.   Nani Gasser, MD

## 2020-09-17 NOTE — Progress Notes (Signed)
Pt reports that his sxs started on or around 09/09/2020. He did have a fever of 101 at the onset of his sxs, denies any fever now. He took a rapid test on 09/12/2020. He reports headache, head congestion,bodyaches,head congestion,non productive cough,nausea(x3 days),extreme fatigue.   He has been taking dayquil,nyquil and B12,D3, and Vitamin C.

## 2020-09-21 ENCOUNTER — Telehealth: Payer: Self-pay

## 2020-09-21 NOTE — Telephone Encounter (Signed)
Jeremy House states he was seen at a virtual visit last week and tested positive for Covid. He now has diarrhea and nausea times 4 days. He felt like it was the antibiotic so he stopped taking it a few days ago. He is still having the diarrhea. He has not tried any OTC medications for diarrhea. I did advise him to try Imodium and drink the the recommended amount of fluids.

## 2020-09-21 NOTE — Telephone Encounter (Signed)
Agree with documentation as above.   Jeremy Swartzendruber, MD  

## 2020-09-28 ENCOUNTER — Encounter: Payer: Self-pay | Admitting: Family Medicine

## 2020-09-28 ENCOUNTER — Telehealth (INDEPENDENT_AMBULATORY_CARE_PROVIDER_SITE_OTHER): Payer: 59 | Admitting: Family Medicine

## 2020-09-28 ENCOUNTER — Ambulatory Visit: Payer: 59 | Admitting: Family Medicine

## 2020-09-28 ENCOUNTER — Telehealth: Payer: 59 | Admitting: Nurse Practitioner

## 2020-09-28 VITALS — BP 168/101

## 2020-09-28 DIAGNOSIS — I10 Essential (primary) hypertension: Secondary | ICD-10-CM

## 2020-09-28 DIAGNOSIS — R059 Cough, unspecified: Secondary | ICD-10-CM | POA: Diagnosis not present

## 2020-09-28 DIAGNOSIS — R110 Nausea: Secondary | ICD-10-CM

## 2020-09-28 DIAGNOSIS — R0602 Shortness of breath: Secondary | ICD-10-CM | POA: Diagnosis not present

## 2020-09-28 DIAGNOSIS — U071 COVID-19: Secondary | ICD-10-CM

## 2020-09-28 MED ORDER — HYDROCHLOROTHIAZIDE 25 MG PO TABS: 25.0000 mg | ORAL_TABLET | Freq: Every day | ORAL | 1 refills | Status: DC

## 2020-09-28 MED ORDER — FLUTICASONE PROPIONATE 50 MCG/ACT NA SUSP: 2.0000 | Freq: Every day | NASAL | 0 refills | Status: DC

## 2020-09-28 MED ORDER — ALBUTEROL SULFATE HFA 108 (90 BASE) MCG/ACT IN AERS: 2.0000 | INHALATION_SPRAY | Freq: Four times a day (QID) | RESPIRATORY_TRACT | 0 refills | Status: DC | PRN

## 2020-09-28 NOTE — Assessment & Plan Note (Signed)
Uncontrolled.  Have seen several pt develop uncontrolled HTN with COVID.  Will tx by adding HCTZ 25mg  to his losartan. Check labs including renal function and for protein loss.  He will go to the lab tomorrow as our building is close bc of weather.  Monitor BP twice a day. Avoid salt.  Drink water.

## 2020-09-28 NOTE — Progress Notes (Signed)
EE-Visit for Corona Virus Screening  We are sorry you are not feeling well. We are here to help!  You have tested positive for COVID-19, meaning that you were infected with the novel coronavirus and could give the virus to others.  It is vitally important that you stay home so you do not spread it to others.      Please continue isolation at home, for at least 10 days since the start of your symptoms and until you have had 24 hours with no fever (without taking a fever reducer) and with improving of symptoms.  If you have no symptoms but tested positive (or all symptoms resolve after 5 days and you have no fever) you can leave your house but continue to wear a mask around others for an additional 5 days. If you have a fever,continue to stay home until you have had 24 hours of no fever. Most cases improve 5-10 days from onset but we have seen a small number of patients who have gotten worse after the 10 days.  Please be sure to watch for worsening symptoms and remain taking the proper precautions.   Go to the nearest hospital ED for assessment if fever/cough/breathlessness are severe or illness seems like a threat to life.    The following symptoms may appear 2-14 days after exposure: . Fever . Cough . Shortness of breath or difficulty breathing . Chills . Repeated shaking with chills . Muscle pain . Headache . Sore throat . New loss of taste or smell . Fatigue . Congestion or runny nose . Nausea or vomiting . Diarrhea  You have been enrolled in [facility] for COVID-19. Daily you will receive a questionnaire within the [facility] website. Our COVID-19 response team will be monitoring your responses daily.  You can use medication such as A prescription inhaler called Albuterol MDI 90 mcg /actuation 2 puffs every 4 hours as needed for shortness of breath, wheezing, cough and A prescription for Fluticasone nasal spray 2 sprays in each nostril one time per day  I have attempted to  reach out to you regarding your shortness of breath complaint. I am providing symptomatic to help with your symptoms. However, should your shortness of breath worsen, you develop difficulty breathing or other concerns, I recommend following up in the nearest emergency room.  You may also take acetaminophen (Tylenol) as needed for fever.  HOME CARE: . Only take medications as instructed by your medical team. . Drink plenty of fluids and get plenty of rest. . A steam or ultrasonic humidifier can help if you have congestion.   GET HELP RIGHT AWAY IF YOU HAVE EMERGENCY WARNING SIGNS.  Call 911 or proceed to your closest emergency facility if: . You develop worsening high fever. . Trouble breathing . Bluish lips or face . Persistent pain or pressure in the chest . New confusion . Inability to wake or stay awake . You cough up blood. . Your symptoms become more severe . Inability to hold down food or fluids  This list is not all possible symptoms. Contact your medical provider for any symptoms that are severe or concerning to you.    Your e-visit answers were reviewed by a board certified advanced clinical practitioner to complete your personal care plan.  Depending on the condition, your plan could have included both over the counter or prescription medications.  If there is a problem please reply once you have received a response from your provider.  Your safety is  important to [place].  If you have drug allergies check your prescription carefully.    You can use MyChart to ask questions about today's visit, request a non-urgent call back, or ask for a work or school excuse for 24 hours related to this e-Visit. If it has been greater than 24 hours you will need to follow up with your provider, or enter a new e-Visit to address those concerns. You will get an e-mail in the next two days asking about your experience.  I hope that your e-visit has been valuable and will speed your recovery. Thank  you for using e-visits.  I have spent at least 5 minutes reviewing and documenting in the patient's chart.

## 2020-09-28 NOTE — Progress Notes (Signed)
Virtual Visit via Video Note  I connected with Jeremy House on 09/28/20 at 11:00 AM EST by a video enabled telemedicine application and verified that I am speaking with the correct person using two identifiers.   I discussed the limitations of evaluation and management by telemedicine and the availability of in person appointments. The patient expressed understanding and agreed to proceed.  Patient location: at home Provider location: at home  Subjective:    CC: COVID -32 sxs  HPI: Had [place] in early January.  Head cold and congestion is getting better.  Still getting nausea and diarrhea. Nausea is almost daily.  BPs have been running high 176/100, 168/100, 185/101. + hot flashes and low energy level, Daily HA.   No vomiting.eating crackers.  Chest feels tight with cough.  Chest is sore.  Feeling winded at time.  No fever.  Qui taking cold medication about 2-3 days ago.    Past medical history, Surgical history, Family history not pertinant except as noted below, Social history, Allergies, and medications have been entered into the medical record, reviewed, and corrections made.   Review of Systems: No fevers, chills, night sweats, weight loss, chest pain, or shortness of breath.   Objective:    General: Speaking clearly in complete sentences without any shortness of breath.  Alert and oriented x3.  Normal judgment. No apparent acute distress.    Impression and Recommendations:    Essential hypertension, benign Uncontrolled.  Have seen several pt develop uncontrolled HTN with COVID.  Will tx by adding HCTZ 25mg  to his losartan. Check labs including renal function and for protein loss.  He will go to the lab tomorrow as our building is close bc of weather.  Monitor BP twice a day. Avoid salt.  Drink water.    Nausea - unclear etiology. Consider gastritis though no vomiting.  Check lipase and CBC. No GERD sxs.    Cough /SOB - if still SOB with activity in one week then plan on chest  CT for further workup.      Time spent in encounter 21 minutes  I discussed the assessment and treatment plan with the patient. The patient was provided an opportunity to ask questions and all were answered. The patient agreed with the plan and demonstrated an understanding of the instructions.   The patient was advised to call back or seek an in-person evaluation if the symptoms worsen or if the condition fails to improve as anticipated.   Beatrice Lecher, MD

## 2020-09-30 ENCOUNTER — Other Ambulatory Visit: Payer: Self-pay | Admitting: *Deleted

## 2020-09-30 DIAGNOSIS — R748 Abnormal levels of other serum enzymes: Secondary | ICD-10-CM

## 2020-09-30 DIAGNOSIS — R7989 Other specified abnormal findings of blood chemistry: Secondary | ICD-10-CM

## 2020-09-30 LAB — COMPLETE METABOLIC PANEL WITH GFR
AG Ratio: 1.9 (calc) (ref 1.0–2.5)
ALT: 48 U/L — ABNORMAL HIGH (ref 9–46)
AST: 32 U/L (ref 10–35)
Albumin: 4.7 g/dL (ref 3.6–5.1)
Alkaline phosphatase (APISO): 43 U/L (ref 35–144)
BUN/Creatinine Ratio: 10 (calc) (ref 6–22)
BUN: 18 mg/dL (ref 7–25)
CO2: 29 mmol/L (ref 20–32)
Calcium: 9.9 mg/dL (ref 8.6–10.3)
Chloride: 104 mmol/L (ref 98–110)
Creat: 1.8 mg/dL — ABNORMAL HIGH (ref 0.70–1.33)
GFR, Est African American: 47 mL/min/{1.73_m2} — ABNORMAL LOW (ref >=60)
GFR, Est Non African American: 40 mL/min/{1.73_m2} — ABNORMAL LOW (ref >=60)
Globulin: 2.5 g/dL (calc) (ref 1.9–3.7)
Glucose, Bld: 78 mg/dL (ref 65–99)
Potassium: 4.6 mmol/L (ref 3.5–5.3)
Sodium: 141 mmol/L (ref 135–146)
Total Bilirubin: 0.5 mg/dL (ref 0.2–1.2)
Total Protein: 7.2 g/dL (ref 6.1–8.1)

## 2020-09-30 LAB — CBC WITH DIFFERENTIAL/PLATELET
Absolute Monocytes: 705 cells/uL (ref 200–950)
Basophils Absolute: 130 cells/uL (ref 0–200)
Basophils Relative: 1.6 %
Eosinophils Absolute: 381 cells/uL (ref 15–500)
Eosinophils Relative: 4.7 %
HCT: 44.6 % (ref 38.5–50.0)
Hemoglobin: 15 g/dL (ref 13.2–17.1)
Lymphs Abs: 1733 cells/uL (ref 850–3900)
MCH: 29.9 pg (ref 27.0–33.0)
MCHC: 33.6 g/dL (ref 32.0–36.0)
MCV: 89 fL (ref 80.0–100.0)
MPV: 10.5 fL (ref 7.5–12.5)
Monocytes Relative: 8.7 %
Neutro Abs: 5152 cells/uL (ref 1500–7800)
Neutrophils Relative %: 63.6 %
Platelets: 383 10*3/uL (ref 140–400)
RBC: 5.01 10*6/uL (ref 4.20–5.80)
RDW: 13 % (ref 11.0–15.0)
Total Lymphocyte: 21.4 %
WBC: 8.1 10*3/uL (ref 3.8–10.8)

## 2020-09-30 LAB — LIPASE: Lipase: 40 U/L (ref 7–60)

## 2020-10-07 LAB — COMPLETE METABOLIC PANEL WITH GFR
AG Ratio: 1.7 (calc) (ref 1.0–2.5)
ALT: 41 U/L (ref 9–46)
AST: 25 U/L (ref 10–35)
Albumin: 4.3 g/dL (ref 3.6–5.1)
Alkaline phosphatase (APISO): 42 U/L (ref 35–144)
BUN: 22 mg/dL (ref 7–25)
CO2: 28 mmol/L (ref 20–32)
Calcium: 9.9 mg/dL (ref 8.6–10.3)
Chloride: 104 mmol/L (ref 98–110)
Creat: 1.32 mg/dL (ref 0.70–1.33)
GFR, Est African American: 68 mL/min/{1.73_m2} (ref >=60)
GFR, Est Non African American: 59 mL/min/{1.73_m2} — ABNORMAL LOW (ref >=60)
Globulin: 2.6 g/dL (calc) (ref 1.9–3.7)
Glucose, Bld: 139 mg/dL (ref 65–139)
Potassium: 4.8 mmol/L (ref 3.5–5.3)
Sodium: 143 mmol/L (ref 135–146)
Total Bilirubin: 0.5 mg/dL (ref 0.2–1.2)
Total Protein: 6.9 g/dL (ref 6.1–8.1)

## 2020-10-20 ENCOUNTER — Other Ambulatory Visit: Payer: Self-pay | Admitting: *Deleted

## 2020-10-20 ENCOUNTER — Other Ambulatory Visit: Payer: Self-pay | Admitting: Family Medicine

## 2020-10-20 ENCOUNTER — Other Ambulatory Visit: Payer: Self-pay | Admitting: Nurse Practitioner

## 2020-10-20 DIAGNOSIS — I10 Essential (primary) hypertension: Secondary | ICD-10-CM

## 2020-10-20 DIAGNOSIS — U071 COVID-19: Secondary | ICD-10-CM

## 2020-10-23 ENCOUNTER — Ambulatory Visit: Payer: 59 | Admitting: Family Medicine

## 2020-10-23 ENCOUNTER — Other Ambulatory Visit: Payer: Self-pay

## 2020-10-23 ENCOUNTER — Encounter: Payer: Self-pay | Admitting: Family Medicine

## 2020-10-23 VITALS — BP 118/79 | HR 63 | Ht 70.0 in | Wt 225.0 lb

## 2020-10-23 DIAGNOSIS — I10 Essential (primary) hypertension: Secondary | ICD-10-CM | POA: Diagnosis not present

## 2020-10-23 DIAGNOSIS — R9431 Abnormal electrocardiogram [ECG] [EKG]: Secondary | ICD-10-CM | POA: Diagnosis not present

## 2020-10-23 DIAGNOSIS — R0789 Other chest pain: Secondary | ICD-10-CM

## 2020-10-23 NOTE — Assessment & Plan Note (Signed)
Pressure looks phenomenal today.  Will recheck potassium and creatinine.  And refill medication.

## 2020-10-23 NOTE — Progress Notes (Signed)
Pt reports that 

## 2020-10-23 NOTE — Progress Notes (Signed)
Acute Office Visit  Subjective:    Patient ID: Jeremy House, male    DOB: Jun 21, 1961, 59 y.o.   MRN: [id number]  Chief Complaint  Patient presents with  . Chest Pain    HPI Patient is in today for chest pain daily x 3-4 weeks.  He describes 3 different types of chest pain.  He said one of the chest pains that he is experiencing is on that left lower chest just lateral to the sternum.  He says it is there for hours a day sometimes three quarters of a day.  It just feels like more of a dull nagging pain it does not stop him from doing anything.  But he is also a couple times a week experienced a stabbing pain in the exact same area.  He says it is very intense when it happens but it is literally just seconds.  He also about 2 weeks ago had more left upper quadrant pain that lasted about 7 minutes.  He said it was intense and actually doubled him over he felt short of breath when that occurred.  He denies any vomiting or change in stools.  He denies any urinary symptoms.  Often times his chest pain occurs at rest.  No SOB with the CP.  Had COVID 6 weeks ago and struggled for several several weeks with nausea and shortness of breath it took him a while to bounce back he does feel like a lot of the symptoms have finally resolved.  He denies any lower extremity swelling or volume overload.  Denies any palpitations.  Past Medical History:  Diagnosis Date  . Essential hypertension, benign 07/19/2013    Past Surgical History:  Procedure Laterality Date  . PILONIDAL CYST EXCISION  1982    Family History  Problem Relation Age of Onset  . Prostate cancer Father   . Colon cancer Maternal Grandfather   . Esophageal cancer Neg Hx   . Rectal cancer Neg Hx   . Stomach cancer Neg Hx     Social History   Socioeconomic History  . Marital status: Married    Spouse name: Not on file  . Number of children: Not on file  . Years of education: Not on file  . Highest education level: Not on file   Occupational History  . Not on file  Tobacco Use  . Smoking status: Never Smoker  . Smokeless tobacco: Never Used  Substance and Sexual Activity  . Alcohol use: No  . Drug use: No  . Sexual activity: Not on file  Other Topics Concern  . Not on file  Social History Narrative  . Not on file   Social Determinants of Health   Financial Resource Strain: Not on file  Food Insecurity: Not on file  Transportation Needs: Not on file  Physical Activity: Not on file  Stress: Not on file  Social Connections: Not on file  Intimate Partner Violence: Not on file    Outpatient Medications Prior to Visit  Medication Sig Dispense Refill  . fenofibrate 160 MG tablet Take 1 tablet (160 mg total) by mouth daily. 90 tablet 0  . hydrochlorothiazide (HYDRODIURIL) 25 MG tablet Take 1 tablet (25 mg total) by mouth daily. 30 tablet 1  . losartan (COZAAR) 100 MG tablet TAKE 1 TABLET BY MOUTH EVERY DAY 90 tablet 1  . albuterol (VENTOLIN HFA) 108 (90 Base) MCG/ACT inhaler Inhale 2 puffs into the lungs every 6 (six) hours as needed for wheezing or  shortness of breath. 8 g 0  . Ascorbic Acid (VITAMIN C) 100 MG tablet Take 100 mg by mouth daily.    . Cholecalciferol (VITAMIN D3) 1.25 MG (50000 UT) CAPS Take by mouth.    . Cyanocobalamin (B-12 PO) Take by mouth.    . Magnesium 250 MG TABS Take 1 tablet by mouth daily.    . Multiple Vitamins-Minerals (MULTIVITAMIN MEN PO) Take 1 tablet by mouth daily.     No facility-administered medications prior to visit.    No Known Allergies  Review of Systems     Objective:    Physical Exam Constitutional:      Appearance: He is well-developed and well-nourished.  HENT:     Head: Normocephalic and atraumatic.  Cardiovascular:     Rate and Rhythm: Normal rate and regular rhythm.     Heart sounds: Normal heart sounds.  Pulmonary:     Effort: Pulmonary effort is normal.     Breath sounds: Normal breath sounds.  Abdominal:     General: Abdomen is flat.  Bowel sounds are normal.     Palpations: Abdomen is soft.     Tenderness: There is no abdominal tenderness. There is no guarding.  Musculoskeletal:        General: No swelling.  Skin:    General: Skin is warm and dry.  Neurological:     Mental Status: He is alert and oriented to person, place, and time.  Psychiatric:        Mood and Affect: Mood and affect normal.        Behavior: Behavior normal.     BP 118/79   Pulse 63   Ht 5\' 10"  (1.778 m)   Wt 225 lb (102.1 kg)   SpO2 98%   BMI 32.28 kg/m  Wt Readings from Last 3 Encounters:  10/23/20 225 lb (102.1 kg)  10/15/19 222 lb (100.7 kg)  06/04/19 218 lb (98.9 kg)    Health Maintenance Due  Topic Date Due  . HIV Screening  Never done  . COVID-19 Vaccine (3 - Booster for Pfizer series) 07/15/2020    There are no preventive care reminders to display for this patient.   Lab Results  Component Value Date   TSH 4.00 07/06/2020   Lab Results  Component Value Date   WBC 8.1 09/29/2020   HGB 15.0 09/29/2020   HCT 44.6 09/29/2020   MCV 89.0 09/29/2020   PLT 383 09/29/2020   Lab Results  Component Value Date   NA 143 10/06/2020   K 4.8 10/06/2020   CO2 28 10/06/2020   GLUCOSE 139 10/06/2020   BUN 22 10/06/2020   CREATININE 1.32 10/06/2020   BILITOT 0.5 10/06/2020   ALKPHOS 31 (L) 09/06/2016   AST 25 10/06/2020   ALT 41 10/06/2020   PROT 6.9 10/06/2020   ALBUMIN 4.4 09/06/2016   CALCIUM 9.9 10/06/2020   Lab Results  Component Value Date   CHOL 169 09/10/2019   Lab Results  Component Value Date   HDL 37 (L) 09/10/2019   Lab Results  Component Value Date   LDLCALC 101 (H) 09/10/2019   Lab Results  Component Value Date   TRIG 185 (H) 09/10/2019   Lab Results  Component Value Date   CHOLHDL 4.6 09/10/2019   No results found for: HGBA1C     Assessment & Plan:   Problem List Items Addressed This Visit      Cardiovascular and Mediastinum   Essential hypertension, benign (Chronic)  Pressure  looks phenomenal today.  Will recheck potassium and creatinine.  And refill medication.      Relevant Orders   Exercise Tolerance Test   Cardiac Stress Test: Informed Consent Details: Physician/Practitioner Attestation; Transcribe to consent form and obtain patient signature    Other Visit Diagnoses    Atypical chest pain    -  Primary   Relevant Orders   EKG 12-Lead   ECHOCARDIOGRAM COMPLETE   CBC with Differential/Platelet   TSH   BASIC METABOLIC PANEL WITH GFR   Troponin I   Exercise Tolerance Test   Cardiac Stress Test: Informed Consent Details: Physician/Practitioner Attestation; Transcribe to consent form and obtain patient signature   Abnormal EKG       Relevant Orders   ECHOCARDIOGRAM COMPLETE   CBC with Differential/Platelet   TSH   BASIC METABOLIC PANEL WITH GFR   Troponin I   Exercise Tolerance Test   Cardiac Stress Test: Informed Consent Details: Physician/Practitioner Attestation; Transcribe to consent form and obtain patient signature     Atypical chest pain-he does have evidence of LVH on his EKG which was not present on an old EKG from 2014 so I like to move forward with some additional labs including CBC, TSH and troponin.  He actually just had a CMP which was normal about 3 weeks ago.  Will also get him scheduled for echocardiogram to evaluate to see if he truly has LVH.  Recently his blood pressures had shot up after having had COVID so we adjusted his blood pressure medication regimen and it is looking much better today so we will go ahead and refill that medicine.  In addition I had like to go ahead and get him scheduled for stress test as well since he is over 31, male, with hypertension.  No orders of the defined types were placed in this encounter.   EKG today shows rate of 63 bpm, normal sinus rhythm with no acute ST-T wave changes.  LVH.    Beatrice Lecher, MD

## 2020-10-24 LAB — CBC WITH DIFFERENTIAL/PLATELET
Absolute Monocytes: 614 cells/uL (ref 200–950)
Basophils Absolute: 112 cells/uL (ref 0–200)
Basophils Relative: 1.2 %
Eosinophils Absolute: 298 cells/uL (ref 15–500)
Eosinophils Relative: 3.2 %
HCT: 44.4 % (ref 38.5–50.0)
Hemoglobin: 15.3 g/dL (ref 13.2–17.1)
Lymphs Abs: 2409 cells/uL (ref 850–3900)
MCH: 30.5 pg (ref 27.0–33.0)
MCHC: 34.5 g/dL (ref 32.0–36.0)
MCV: 88.4 fL (ref 80.0–100.0)
MPV: 10.3 fL (ref 7.5–12.5)
Monocytes Relative: 6.6 %
Neutro Abs: 5868 cells/uL (ref 1500–7800)
Neutrophils Relative %: 63.1 %
Platelets: 424 10*3/uL — ABNORMAL HIGH (ref 140–400)
RBC: 5.02 10*6/uL (ref 4.20–5.80)
RDW: 13 % (ref 11.0–15.0)
Total Lymphocyte: 25.9 %
WBC: 9.3 10*3/uL (ref 3.8–10.8)

## 2020-10-24 LAB — BASIC METABOLIC PANEL WITH GFR
BUN: 18 mg/dL (ref 7–25)
CO2: 24 mmol/L (ref 20–32)
Calcium: 9.9 mg/dL (ref 8.6–10.3)
Chloride: 105 mmol/L (ref 98–110)
Creat: 1.28 mg/dL (ref 0.70–1.33)
GFR, Est African American: 71 mL/min/{1.73_m2} (ref >=60)
GFR, Est Non African American: 61 mL/min/{1.73_m2} (ref >=60)
Glucose, Bld: 82 mg/dL (ref 65–99)
Potassium: 4.5 mmol/L (ref 3.5–5.3)
Sodium: 141 mmol/L (ref 135–146)

## 2020-10-24 LAB — EXTRA SPECIMEN

## 2020-10-24 LAB — TROPONIN I: Troponin I: <3 ng/L (ref <=47)

## 2020-10-24 LAB — TSH: TSH: 5.06 mIU/L — ABNORMAL HIGH (ref 0.40–4.50)

## 2020-11-13 ENCOUNTER — Telehealth (HOSPITAL_COMMUNITY): Payer: Self-pay | Admitting: *Deleted

## 2020-11-13 NOTE — Telephone Encounter (Signed)
Close encounter 

## 2020-11-14 ENCOUNTER — Other Ambulatory Visit: Payer: Self-pay | Admitting: Family Medicine

## 2020-11-14 ENCOUNTER — Other Ambulatory Visit (HOSPITAL_COMMUNITY)
Admission: RE | Admit: 2020-11-14 | Discharge: 2020-11-14 | Disposition: A | Discharge status: Home / Self Care | Payer: 59 | Source: Ambulatory Visit | Attending: Family Medicine | Admitting: Family Medicine

## 2020-11-14 DIAGNOSIS — Z20822 Contact with and (suspected) exposure to covid-19: Secondary | ICD-10-CM | POA: Insufficient documentation

## 2020-11-14 DIAGNOSIS — Z01812 Encounter for preprocedural laboratory examination: Secondary | ICD-10-CM | POA: Diagnosis not present

## 2020-11-14 LAB — SARS CORONAVIRUS 2 (TAT 6-24 HRS): SARS Coronavirus 2: NEGATIVE

## 2020-11-16 ENCOUNTER — Telehealth: Payer: Self-pay | Admitting: *Deleted

## 2020-11-16 DIAGNOSIS — R899 Unspecified abnormal finding in specimens from other organs, systems and tissues: Secondary | ICD-10-CM

## 2020-11-16 DIAGNOSIS — K921 Melena: Secondary | ICD-10-CM

## 2020-11-16 NOTE — Telephone Encounter (Signed)
Okay, I would recommend that he consider seeing GI if he is noticing that much bright red blood with stools.  We can place referral if he would like it looked like his colonoscopy is up-to-date but he may have some hemorrhoids etc.

## 2020-11-16 NOTE — Telephone Encounter (Signed)
Pt called in today wanting to make sure the lab order was placed for recheck CBC and wanted you to know that over the last 1.5 week he has noticed bright red blood when he has a BM.  He said that you'd asked him about that at his appointment in Feb and at that time he did NOT have bloody stools.  He said he notices it in about 50% of his bowel movements.  I asked him if they were more hard and formed than normal and he said no.

## 2020-11-17 ENCOUNTER — Ambulatory Visit (HOSPITAL_BASED_OUTPATIENT_CLINIC_OR_DEPARTMENT_OTHER): Payer: 59

## 2020-11-17 ENCOUNTER — Other Ambulatory Visit: Payer: Self-pay

## 2020-11-17 ENCOUNTER — Ambulatory Visit (HOSPITAL_COMMUNITY)
Admission: RE | Admit: 2020-11-17 | Discharge: 2020-11-17 | Disposition: A | Discharge status: Home / Self Care | Payer: 59 | Source: Ambulatory Visit | Attending: Internal Medicine | Admitting: Internal Medicine

## 2020-11-17 DIAGNOSIS — R9431 Abnormal electrocardiogram [ECG] [EKG]: Secondary | ICD-10-CM | POA: Diagnosis not present

## 2020-11-17 DIAGNOSIS — R0789 Other chest pain: Secondary | ICD-10-CM | POA: Insufficient documentation

## 2020-11-17 DIAGNOSIS — I10 Essential (primary) hypertension: Secondary | ICD-10-CM | POA: Insufficient documentation

## 2020-11-17 LAB — CBC WITH DIFFERENTIAL/PLATELET
Absolute Monocytes: 528 cells/uL (ref 200–950)
Basophils Absolute: 106 cells/uL (ref 0–200)
Basophils Relative: 1.2 %
Eosinophils Absolute: 458 cells/uL (ref 15–500)
Eosinophils Relative: 5.2 %
HCT: 46 % (ref 38.5–50.0)
Hemoglobin: 15.6 g/dL (ref 13.2–17.1)
Lymphs Abs: 2042 cells/uL (ref 850–3900)
MCH: 30.4 pg (ref 27.0–33.0)
MCHC: 33.9 g/dL (ref 32.0–36.0)
MCV: 89.7 fL (ref 80.0–100.0)
MPV: 10.3 fL (ref 7.5–12.5)
Monocytes Relative: 6 %
Neutro Abs: 5667 cells/uL (ref 1500–7800)
Neutrophils Relative %: 64.4 %
Platelets: 388 10*3/uL (ref 140–400)
RBC: 5.13 10*6/uL (ref 4.20–5.80)
RDW: 12.8 % (ref 11.0–15.0)
Total Lymphocyte: 23.2 %
WBC: 8.8 10*3/uL (ref 3.8–10.8)

## 2020-11-17 LAB — EXERCISE TOLERANCE TEST
Estimated workload: 10.1 METS
Exercise duration (min): 9 min
Exercise duration (sec): 0 s
MPHR: 161 {beats}/min
Peak HR: 150 {beats}/min
Percent HR: 93 %
Rest HR: 80 {beats}/min

## 2020-11-17 LAB — ECHOCARDIOGRAM COMPLETE
Area-P 1/2: 3.03 cm2
S' Lateral: 3 cm

## 2020-11-17 NOTE — Telephone Encounter (Signed)
[facility] notifying pt of provider recommendation and requested a call back regarding a referral.

## 2020-11-17 NOTE — Telephone Encounter (Signed)
Order signed.

## 2020-11-17 NOTE — Telephone Encounter (Signed)
Jeremy House called back and states he is ok with referral to GI. Pended referral.

## 2020-11-18 ENCOUNTER — Other Ambulatory Visit (HOSPITAL_BASED_OUTPATIENT_CLINIC_OR_DEPARTMENT_OTHER): Payer: 59

## 2020-11-18 NOTE — Telephone Encounter (Signed)
All labs are normal. 

## 2020-11-21 ENCOUNTER — Other Ambulatory Visit: Payer: Self-pay | Admitting: Family Medicine

## 2020-11-21 DIAGNOSIS — I10 Essential (primary) hypertension: Secondary | ICD-10-CM

## 2020-12-06 ENCOUNTER — Other Ambulatory Visit: Payer: Self-pay | Admitting: Family Medicine

## 2020-12-06 DIAGNOSIS — E781 Pure hyperglyceridemia: Secondary | ICD-10-CM

## 2020-12-10 ENCOUNTER — Telehealth (INDEPENDENT_AMBULATORY_CARE_PROVIDER_SITE_OTHER): Payer: 59 | Admitting: Family Medicine

## 2020-12-10 VITALS — Temp 98.3°F

## 2020-12-10 DIAGNOSIS — R059 Cough, unspecified: Secondary | ICD-10-CM

## 2020-12-10 DIAGNOSIS — J019 Acute sinusitis, unspecified: Secondary | ICD-10-CM

## 2020-12-10 MED ORDER — BENZONATATE 200 MG PO CAPS: 200.0000 mg | ORAL_CAPSULE | Freq: Three times a day (TID) | ORAL | 0 refills | Status: DC | PRN

## 2020-12-10 MED ORDER — AZITHROMYCIN 250 MG PO TABS: ORAL_TABLET | ORAL | 0 refills | Status: AC

## 2020-12-10 NOTE — Progress Notes (Signed)
Virtual Visit via Video Note  I connected with Muhsin Doris Pardo on 12/10/20 at 11:30 AM EDT by a video enabled telemedicine application and verified that I am speaking with the correct person using two identifiers.   I discussed the limitations of evaluation and management by telemedicine and the availability of in person appointments. The patient expressed understanding and agreed to proceed.  Patient location: at home  Provider location: in office  Subjective:    CC: SOB  HPI: Pt reports that he has been fighting "something" for the 6 days feels that he began feeling worse yesterday. He had a slight temperature last night of 100.3. no worsening SOB.  Mostly runny nose. But also some congestion.  No facial pain. No GI upset.    His sxs have been body aches, headache, cough that is worse at night, nasal drainage (no color). He has tried night time cold medication (theraflu) and allergy medication that didn't seem to help. This morning about 8 AM he took some Thera flu.   No sick contacts. He took an at home COVID test last night this was negative.    Past medical history, Surgical history, Family history not pertinant except as noted below, Social history, Allergies, and medications have been entered into the medical record, reviewed, and corrections made.   Review of Systems: No fevers, chills, night sweats, weight loss, chest pain, or shortness of breath.   Objective:    General: Speaking clearly in complete sentences without any shortness of breath.  Alert and oriented x3.  Normal judgment. No apparent acute distress.    Impression and Recommendations:    No problem-specific Assessment & Plan notes found for this encounter.  Acute sinusitis/cough-symptoms x6 days.  He ran a fever for the first time last night so I am concerned that he is actually getting worse so we did discuss going ahead and treating with an antibiotic and Tessalon Perles.  Though did discuss with them that this  still could be viral but certainly within the realm of possibility he did do a home Covid test he actually had COVID a couple months ago.  Also consider could be flu but we really have not seen that many glucose places in the last several weeks and he would be out of the window for Tamiflu.  Continue with symptomatic care.  Call if not somewhat better after the weekend.    Time spent in encounter 21 minutes  I discussed the assessment and treatment plan with the patient. The patient was provided an opportunity to ask questions and all were answered. The patient agreed with the plan and demonstrated an understanding of the instructions.   The patient was advised to call back or seek an in-person evaluation if the symptoms worsen or if the condition fails to improve as anticipated.   Beatrice Lecher, MD

## 2020-12-10 NOTE — Progress Notes (Signed)
Pt reports that he has been fighting "something" for the past few days feels that he began feeling worse yesterday. He had a slight temperature last night of 100.3  His sxs have been body aches, headache, cough that is worse at night, nasal drainage (no color). He has tried night time cold medication and allergy medication that didn't seem to help. This morning about 8 AM he took some Thera flu.   No sick contacts. He took an at home COVID test last night this was negative.

## 2021-02-03 ENCOUNTER — Other Ambulatory Visit: Payer: Self-pay | Admitting: Family Medicine

## 2021-02-25 ENCOUNTER — Other Ambulatory Visit: Payer: Self-pay | Admitting: *Deleted

## 2021-05-11 ENCOUNTER — Other Ambulatory Visit: Payer: Self-pay | Admitting: Family Medicine

## 2021-05-11 DIAGNOSIS — I10 Essential (primary) hypertension: Secondary | ICD-10-CM

## 2021-08-06 ENCOUNTER — Other Ambulatory Visit: Payer: Self-pay | Admitting: Family Medicine

## 2021-08-06 DIAGNOSIS — I10 Essential (primary) hypertension: Secondary | ICD-10-CM

## 2021-08-09 ENCOUNTER — Telehealth: Payer: 59 | Admitting: Emergency Medicine

## 2021-08-09 DIAGNOSIS — R051 Acute cough: Secondary | ICD-10-CM

## 2021-08-09 MED ORDER — BENZONATATE 100 MG PO CAPS: 100.0000 mg | ORAL_CAPSULE | Freq: Two times a day (BID) | ORAL | 0 refills | Status: DC | PRN

## 2021-08-09 NOTE — Progress Notes (Signed)
We are sorry that you are not feeling well.  Here is how we plan to help!  Based on your presentation I believe you most likely have A cough due to a virus.  This is called viral bronchitis and is best treated by rest, plenty of fluids and control of the cough.  You may use Ibuprofen or Tylenol as directed to help your symptoms.     In addition you may use A prescription cough medication called Tessalon Perles 100mg. You may take 1-2 capsules every 8 hours as needed for your cough.    From your responses in the eVisit questionnaire you describe inflammation in the upper respiratory tract which is causing a significant cough.  This is commonly called Bronchitis and has four common causes:   Allergies Viral Infections Acid Reflux Bacterial Infection Allergies, viruses and acid reflux are treated by controlling symptoms or eliminating the cause. An example might be a cough caused by taking certain blood pressure medications. You stop the cough by changing the medication. Another example might be a cough caused by acid reflux. Controlling the reflux helps control the cough.     HOME CARE Only take medications as instructed by your medical team. Complete the entire course of an antibiotic. Drink plenty of fluids and get plenty of rest. Avoid close contacts especially the very young and the elderly Cover your mouth if you cough or cough into your sleeve. Always remember to wash your hands A steam or ultrasonic humidifier can help congestion.   GET HELP RIGHT AWAY IF: You develop worsening fever. You become short of breath You cough up blood. Your symptoms persist after you have completed your treatment plan MAKE SURE YOU  Understand these instructions. Will watch your condition. Will get help right away if you are not doing well or get worse.    Thank you for choosing an e-visit.  Your e-visit answers were reviewed by a board certified advanced clinical practitioner to complete your  personal care plan. Depending upon the condition, your plan could have included both over the counter or prescription medications.  Please review your pharmacy choice. Make sure the pharmacy is open so you can pick up prescription now. If there is a problem, you may contact your provider through MyChart messaging and have the prescription routed to another pharmacy.  Your safety is important to us. If you have drug allergies check your prescription carefully.   For the next 24 hours you can use MyChart to ask questions about today's visit, request a non-urgent call back, or ask for a work or school excuse. You will get an email in the next two days asking about your experience. I hope that your e-visit has been valuable and will speed your recovery.  Approximately 5 minutes was used in reviewing the patient's chart, questionnaire, prescribing medications, and documentation.  

## 2021-08-10 ENCOUNTER — Emergency Department (INDEPENDENT_AMBULATORY_CARE_PROVIDER_SITE_OTHER)
Admission: EM | Admit: 2021-08-10 | Discharge: 2021-08-10 | Disposition: A | Discharge status: Home / Self Care | Payer: BC Managed Care – PPO | Source: Home / Self Care

## 2021-08-10 ENCOUNTER — Other Ambulatory Visit: Payer: Self-pay

## 2021-08-10 DIAGNOSIS — J111 Influenza due to unidentified influenza virus with other respiratory manifestations: Secondary | ICD-10-CM

## 2021-08-10 DIAGNOSIS — R059 Cough, unspecified: Secondary | ICD-10-CM

## 2021-08-10 LAB — POC INFLUENZA A AND B ANTIGEN (URGENT CARE ONLY)
Influenza A Ag: NEGATIVE
Influenza B Ag: NEGATIVE

## 2021-08-10 MED ORDER — BENZONATATE 200 MG PO CAPS: 200.0000 mg | ORAL_CAPSULE | Freq: Three times a day (TID) | ORAL | 0 refills | Status: AC | PRN

## 2021-08-10 MED ORDER — OSELTAMIVIR PHOSPHATE 75 MG PO CAPS: 75.0000 mg | ORAL_CAPSULE | Freq: Two times a day (BID) | ORAL | 0 refills | Status: DC

## 2021-08-10 MED ORDER — PROMETHAZINE-DM 6.25-15 MG/5ML PO SYRP: 5.0000 mL | ORAL_SOLUTION | Freq: Two times a day (BID) | ORAL | 0 refills | Status: DC | PRN

## 2021-08-10 NOTE — Discharge Instructions (Addendum)
Advised patient to take medication as directed with food to completion.  Advised patient may use Tessalon Perles during the day for cough and Promethazine DM during the night if coughing is disrupting sleep.  Advised patient not to take Tessalon Perles and Promethazine DM together.  Encouraged patient increase daily water intake while taking these medications.

## 2021-08-10 NOTE — ED Triage Notes (Signed)
Pt presents with cough, body aches, and HA that began this weekend. Pt did e-visit yesterday and got a rx for benzonatate. Neg home covid test x2

## 2021-08-10 NOTE — ED Provider Notes (Signed)
[facility]    CSN: [id number] Arrival date & time: 08/10/21  1721      History   Chief Complaint Chief Complaint  Patient presents with   Cough    In car    HPI Jeremy House is a 60 y.o. male.   HPI 60 year old male presents with cough, body aches, and headaches that began 3 days ago.  Patient reports ED visit yesterday was prescribed Tessalon Perles.  Patient reports 2 negative home COVID test.  Past Medical History:  Diagnosis Date   Essential hypertension, benign 07/19/2013    Patient Active Problem List   Diagnosis Date Noted   Migraine 10/15/2019   Benign prostatic hyperplasia with nocturia 10/15/2019   Snoring 06/04/2019   Hypertriglyceridemia 07/23/2013   Essential hypertension, benign 07/19/2013    Past Surgical History:  Procedure Laterality Date   PILONIDAL CYST EXCISION  1982       Home Medications    Prior to Admission medications   Medication Sig Start Date End Date Taking? Authorizing Provider  benzonatate (TESSALON) 200 MG capsule Take 1 capsule (200 mg total) by mouth 3 (three) times daily as needed for up to 7 days for cough. 08/10/21 08/17/21 Yes Eliezer Lofts, FNP  oseltamivir (TAMIFLU) 75 MG capsule Take 1 capsule (75 mg total) by mouth every 12 (twelve) hours. 08/10/21  Yes Eliezer Lofts, FNP  promethazine-dextromethorphan (PROMETHAZINE-DM) 6.25-15 MG/5ML syrup Take 5 mLs by mouth 2 (two) times daily as needed for cough. 08/10/21  Yes Eliezer Lofts, FNP  fenofibrate 160 MG tablet TAKE 1 TABLET BY MOUTH EVERY DAY 12/07/20   Hali Marry, MD  hydrochlorothiazide (HYDRODIURIL) 25 MG tablet TAKE 1 TABLET (25 MG TOTAL) BY MOUTH DAILY. 08/09/21   Hali Marry, MD  losartan (COZAAR) 50 MG tablet TAKE 2 TABLETS BY MOUTH EVERY DAY 08/09/21   Hali Marry, MD  valACYclovir (VALTREX) 1000 MG tablet TAKE TWO TABLETS EVERY 12 HOURS FOR ONLY ONE DAY. START ASAP AFTER COLD SORE OUTBREAK. 02/03/21   Hali Marry, MD    Family History Family History  Problem Relation Age of Onset   Prostate cancer Father    Colon cancer Maternal Grandfather    Esophageal cancer Neg Hx    Rectal cancer Neg Hx    Stomach cancer Neg Hx     Social History Social History   Tobacco Use   Smoking status: Never   Smokeless tobacco: Never  Substance Use Topics   Alcohol use: No   Drug use: No     Allergies   Patient has no known allergies.   Review of Systems Review of Systems   Physical Exam Triage Vital Signs ED Triage Vitals  Enc Vitals Group     BP 08/10/21 1906 (!) 146/88     Pulse Rate 08/10/21 1906 78     Resp 08/10/21 1906 14     Temp 08/10/21 1906 98.8 F (37.1 C)     Temp Source 08/10/21 1906 Oral     SpO2 08/10/21 1906 97 %     Weight --      Height --      Head Circumference --      Peak Flow --      Pain Score 08/10/21 1758 0     Pain Loc --      Pain Edu? --      Excl. in [place]? --    No data found.  Updated Vital Signs BP (!) 146/88 (  BP Location: Left Arm)   Pulse 78   Temp 98.8 F (37.1 C) (Oral)   Resp 14   SpO2 97%       Physical Exam Vitals and nursing note reviewed.  Constitutional:      Appearance: Normal appearance. He is obese. He is ill-appearing.  HENT:     Head: Normocephalic and atraumatic.     Right Ear: Tympanic membrane, ear canal and external ear normal.     Left Ear: Tympanic membrane, ear canal and external ear normal.     Mouth/Throat:     Mouth: Mucous membranes are dry.     Pharynx: Oropharynx is clear.  Eyes:     Extraocular Movements: Extraocular movements intact.     Conjunctiva/sclera: Conjunctivae normal.     Pupils: Pupils are equal, round, and reactive to light.  Cardiovascular:     Rate and Rhythm: Normal rate and regular rhythm.     Pulses: Normal pulses.     Heart sounds: Normal heart sounds.  Pulmonary:     Effort: Pulmonary effort is normal.     Breath sounds: Normal breath sounds. No wheezing, rhonchi or rales.   Musculoskeletal:        General: Normal range of motion.     Cervical back: Normal range of motion and neck supple.  Skin:    General: Skin is warm and dry.  Neurological:     General: No focal deficit present.     Mental Status: He is alert and oriented to person, place, and time.     UC Treatments / Results  Labs (all labs ordered are listed, but only abnormal results are displayed) Labs Reviewed  POC INFLUENZA A AND B ANTIGEN (URGENT CARE ONLY)    EKG   Radiology No results found.  Procedures Procedures (including critical care time)  Medications Ordered in UC Medications - No data to display  Initial Impression / Assessment and Plan / UC Course  I have reviewed the triage vital signs and the nursing notes.  Pertinent labs & imaging results that were available during my care of the patient were reviewed by me and considered in my medical decision making (see chart for details).     MDM: 1.  Cough-Rx'd Tessalon Perles and Promethazine DM; 2.  Influenza-like illness-Rx'd Tamiflu. Advised patient to take medication as directed with food to completion.  Advised patient may use Tessalon Perles during the day for cough and Promethazine DM during the night if coughing is disrupting sleep.  Advised patient not to take Tessalon Perles and Promethazine DM together.  Encouraged patient increase daily water intake while taking these medications.  Patient discharged home, hemodynamically stable. Final Clinical Impressions(s) / UC Diagnoses   Final diagnoses:  Cough, unspecified type  Influenza-like illness     Discharge Instructions      Advised patient to take medication as directed with food to completion.  Advised patient may use Tessalon Perles during the day for cough and Promethazine DM during the night if coughing is disrupting sleep.  Advised patient not to take Tessalon Perles and Promethazine DM together.  Encouraged patient increase daily water intake while taking  these medications.     ED Prescriptions     Medication Sig Dispense Auth. Provider   oseltamivir (TAMIFLU) 75 MG capsule Take 1 capsule (75 mg total) by mouth every 12 (twelve) hours. 10 capsule Eliezer Lofts, FNP   benzonatate (TESSALON) 200 MG capsule Take 1 capsule (200 mg total) by mouth 3 (  three) times daily as needed for up to 7 days for cough. 40 capsule Eliezer Lofts, FNP   promethazine-dextromethorphan (PROMETHAZINE-DM) 6.25-15 MG/5ML syrup Take 5 mLs by mouth 2 (two) times daily as needed for cough. 118 mL Eliezer Lofts, FNP      PDMP not reviewed this encounter.   Eliezer Lofts, [place] 08/10/21 2024

## 2021-09-08 ENCOUNTER — Other Ambulatory Visit: Payer: Self-pay | Admitting: Family Medicine

## 2021-09-08 DIAGNOSIS — E781 Pure hyperglyceridemia: Secondary | ICD-10-CM

## 2021-09-12 ENCOUNTER — Ambulatory Visit: Payer: Self-pay

## 2021-10-01 ENCOUNTER — Other Ambulatory Visit: Payer: Self-pay | Admitting: Family Medicine

## 2021-10-01 DIAGNOSIS — E781 Pure hyperglyceridemia: Secondary | ICD-10-CM

## 2021-10-12 ENCOUNTER — Other Ambulatory Visit: Payer: Self-pay | Admitting: Family Medicine

## 2021-10-12 DIAGNOSIS — E781 Pure hyperglyceridemia: Secondary | ICD-10-CM

## 2021-10-20 DIAGNOSIS — H2513 Age-related nuclear cataract, bilateral: Secondary | ICD-10-CM | POA: Diagnosis not present

## 2021-10-20 DIAGNOSIS — H25032 Anterior subcapsular polar age-related cataract, left eye: Secondary | ICD-10-CM | POA: Diagnosis not present

## 2021-10-20 DIAGNOSIS — H25042 Posterior subcapsular polar age-related cataract, left eye: Secondary | ICD-10-CM | POA: Diagnosis not present

## 2021-10-20 DIAGNOSIS — H43811 Vitreous degeneration, right eye: Secondary | ICD-10-CM | POA: Diagnosis not present

## 2021-10-21 ENCOUNTER — Other Ambulatory Visit: Payer: Self-pay

## 2021-10-21 ENCOUNTER — Ambulatory Visit: Payer: BC Managed Care – PPO | Admitting: Physician Assistant

## 2021-10-21 ENCOUNTER — Encounter: Payer: Self-pay | Admitting: Physician Assistant

## 2021-10-21 DIAGNOSIS — L820 Inflamed seborrheic keratosis: Secondary | ICD-10-CM

## 2021-10-21 DIAGNOSIS — H2512 Age-related nuclear cataract, left eye: Secondary | ICD-10-CM | POA: Insufficient documentation

## 2021-10-21 DIAGNOSIS — Z1283 Encounter for screening for malignant neoplasm of skin: Secondary | ICD-10-CM | POA: Diagnosis not present

## 2021-10-21 DIAGNOSIS — D485 Neoplasm of uncertain behavior of skin: Secondary | ICD-10-CM

## 2021-10-21 NOTE — Patient Instructions (Signed)

## 2021-10-21 NOTE — Progress Notes (Signed)
° °  New Patient   Subjective  Jeremy House is a 61 y.o. male who presents for the following: Annual Exam (Lesion on back x year. No itching or bleeding. ).   The following portions of the chart were reviewed this encounter and updated as appropriate:  Tobacco   Allergies   Problems   Med Hx   Surg Hx   Fam Hx       Objective  Well appearing patient in no apparent distress; mood and affect are within normal limits.  A full examination was performed including scalp, head, eyes, ears, nose, lips, neck, chest, axillae, abdomen, back, buttocks, bilateral upper extremities, bilateral lower extremities, hands, feet, fingers, toes, fingernails, and toenails. All findings within normal limits unless otherwise noted below.  head to toe No atypical nevi No signs of non-mole skin cancer.   Left Lower Back Thick black crust.     Right Lower Back      Assessment & Plan  Encounter for screening for malignant neoplasm of skin head to toe  Yearly skin exams  Neoplasm of uncertain behavior of skin (2) Left Lower Back  Skin / nail biopsy Type of biopsy: tangential   Informed consent: discussed and consent obtained   Timeout: patient name, date of birth, surgical site, and procedure verified   Anesthesia: the lesion was anesthetized in a standard fashion   Anesthetic:  1% lidocaine w/ epinephrine 1-100,000 local infiltration Instrument used: flexible razor blade   Hemostasis achieved with: aluminum chloride and electrodesiccation   Outcome: patient tolerated procedure well   Post-procedure details: wound care instructions given    Specimen 1 - Surgical pathology Differential Diagnosis: isk  Check Margins: No  Right Lower Back  Skin / nail biopsy Type of biopsy: tangential   Informed consent: discussed and consent obtained   Timeout: patient name, date of birth, surgical site, and procedure verified   Anesthesia: the lesion was anesthetized in a standard fashion   Anesthetic:   1% lidocaine w/ epinephrine 1-100,000 local infiltration Instrument used: flexible razor blade   Hemostasis achieved with: aluminum chloride and electrodesiccation   Outcome: patient tolerated procedure well   Post-procedure details: wound care instructions given    Specimen 2 - Surgical pathology Differential Diagnosis: isk  Check Margins: No     I, Chetan Mehring, PA-C, have reviewed all documentation's for this visit.  The documentation on 10/21/21 for the exam, diagnosis, procedures and orders are all accurate and complete.

## 2021-11-03 ENCOUNTER — Other Ambulatory Visit: Payer: Self-pay | Admitting: Family Medicine

## 2021-11-03 DIAGNOSIS — I10 Essential (primary) hypertension: Secondary | ICD-10-CM

## 2021-11-18 ENCOUNTER — Other Ambulatory Visit: Payer: Self-pay | Admitting: Family Medicine

## 2021-11-18 DIAGNOSIS — E781 Pure hyperglyceridemia: Secondary | ICD-10-CM

## 2021-11-27 ENCOUNTER — Other Ambulatory Visit: Payer: Self-pay | Admitting: Family Medicine

## 2021-11-27 DIAGNOSIS — I10 Essential (primary) hypertension: Secondary | ICD-10-CM

## 2021-12-01 ENCOUNTER — Ambulatory Visit (INDEPENDENT_AMBULATORY_CARE_PROVIDER_SITE_OTHER): Payer: BC Managed Care – PPO

## 2021-12-01 ENCOUNTER — Encounter: Payer: Self-pay | Admitting: Family Medicine

## 2021-12-01 ENCOUNTER — Ambulatory Visit: Payer: BC Managed Care – PPO | Admitting: Family Medicine

## 2021-12-01 ENCOUNTER — Other Ambulatory Visit: Payer: Self-pay

## 2021-12-01 VITALS — BP 135/83 | HR 59 | Ht 71.0 in | Wt 230.0 lb

## 2021-12-01 DIAGNOSIS — N401 Enlarged prostate with lower urinary tract symptoms: Secondary | ICD-10-CM

## 2021-12-01 DIAGNOSIS — R0781 Pleurodynia: Secondary | ICD-10-CM

## 2021-12-01 DIAGNOSIS — Z23 Encounter for immunization: Secondary | ICD-10-CM | POA: Diagnosis not present

## 2021-12-01 DIAGNOSIS — Z125 Encounter for screening for malignant neoplasm of prostate: Secondary | ICD-10-CM | POA: Diagnosis not present

## 2021-12-01 DIAGNOSIS — E781 Pure hyperglyceridemia: Secondary | ICD-10-CM | POA: Diagnosis not present

## 2021-12-01 DIAGNOSIS — I10 Essential (primary) hypertension: Secondary | ICD-10-CM

## 2021-12-01 DIAGNOSIS — R351 Nocturia: Secondary | ICD-10-CM

## 2021-12-01 DIAGNOSIS — M5431 Sciatica, right side: Secondary | ICD-10-CM

## 2021-12-01 MED ORDER — HYDROCHLOROTHIAZIDE 25 MG PO TABS: 25.0000 mg | ORAL_TABLET | Freq: Every day | ORAL | 3 refills | Status: DC

## 2021-12-01 MED ORDER — LOSARTAN POTASSIUM 50 MG PO TABS: 100.0000 mg | ORAL_TABLET | Freq: Every day | ORAL | 1 refills | Status: DC

## 2021-12-01 MED ORDER — FENOFIBRATE 160 MG PO TABS: 160.0000 mg | ORAL_TABLET | Freq: Every day | ORAL | 3 refills | Status: DC

## 2021-12-01 NOTE — Assessment & Plan Note (Signed)
Due to recheck lipids. 

## 2021-12-01 NOTE — Progress Notes (Signed)
? ?Established Patient Office Visit ? ?Subjective:  ?Patient ID: Jeremy House, male    DOB: 07-22-1961  Age: 61 y.o. MRN: [id number] ? ?CC:  ?Chief Complaint  ?Patient presents with  ? Hypertension  ? ? ?HPI ?Jeremy House presents for  ? ?Hypertension- Pt denies chest pain, SOB, dizziness, or heart palpitations.  Taking meds as directed w/o problems.  Denies medication side effects.  Occ BP in the 140s at home.  Has been eating a low salt diet.  Also under a lot of stress t work.   ? ?F/U hyperlipidemia - on fenofibrate.  Tolerating well.  ? ?  Also c/o pain in right buttock and down into his leg for about 1.5 weeks. Says it is getting a little better. Has been using IBU.   ? ?He also complains of persistent discomfort and tenderness over the left lower rib cage she says its been going on for quite some time it was even going on when he had his cardiac work-up. ? ? ?Past Medical History:  ?Diagnosis Date  ? Essential hypertension, benign 07/19/2013  ? ? ?Past Surgical History:  ?Procedure Laterality Date  ? PILONIDAL CYST EXCISION  1982  ? ? ?Family History  ?Problem Relation Age of Onset  ? Prostate cancer Father   ? Colon cancer Maternal Grandfather   ? Esophageal cancer Neg Hx   ? Rectal cancer Neg Hx   ? Stomach cancer Neg Hx   ? ? ?Social History  ? ?Socioeconomic History  ? Marital status: Married  ?  Spouse name: Not on file  ? Number of children: Not on file  ? Years of education: Not on file  ? Highest education level: Not on file  ?Occupational History  ? Not on file  ?Tobacco Use  ? Smoking status: Never  ? Smokeless tobacco: Never  ?Vaping Use  ? Vaping Use: Never used  ?Substance and Sexual Activity  ? Alcohol use: No  ? Drug use: No  ? Sexual activity: Not on file  ?Other Topics Concern  ? Not on file  ?Social History Narrative  ? Not on file  ? ?Social Determinants of Health  ? ?Financial Resource Strain: Not on file  ?Food Insecurity: Not on file  ?Transportation Needs: Not on file  ?Physical Activity:  Not on file  ?Stress: Not on file  ?Social Connections: Not on file  ?Intimate Partner Violence: Not on file  ? ? ?Outpatient Medications Prior to Visit  ?Medication Sig Dispense Refill  ? moxifloxacin (VIGAMOX) 0.5 % ophthalmic solution Place 1 drop into the left eye 4 (four) times daily.  1  ? prednisoLONE acetate (PRED FORTE) 1 % ophthalmic suspension Place 1 drop into the left eye 4 (four) times daily.  2  ? valACYclovir (VALTREX) 1000 MG tablet TAKE TWO TABLETS EVERY 12 HOURS FOR ONLY ONE DAY. START ASAP AFTER COLD SORE OUTBREAK. 20 tablet 1  ? fenofibrate 160 MG tablet TAKE 1 TABLET BY MOUTH EVERY DAY 7 tablet 0  ? hydrochlorothiazide (HYDRODIURIL) 25 MG tablet TAKE 1 TABLET (25 MG TOTAL) BY MOUTH DAILY. 30 tablet 0  ? losartan (COZAAR) 50 MG tablet TAKE 2 TABLETS BY MOUTH EVERY DAY 60 tablet 0  ? ?No facility-administered medications prior to visit.  ? ? ?No Known Allergies ? ?ROS ?Review of Systems ? ?  ?Objective:  ?  ?Physical Exam ?Constitutional:   ?   Appearance: Normal appearance. He is well-developed.  ?HENT:  ?   Head: Normocephalic  and atraumatic.  ?Cardiovascular:  ?   Rate and Rhythm: Normal rate and regular rhythm.  ?   Heart sounds: Normal heart sounds.  ?Pulmonary:  ?   Effort: Pulmonary effort is normal.  ?   Breath sounds: Normal breath sounds.  ?Musculoskeletal:  ?   Comments: Negative straight leg raise on the right.  Tender over the lower anterior left sided ribs.  He really has some pinpoint tenderness directly over the rib.  No mass or lesion or swelling.  ?Skin: ?   General: Skin is warm and dry.  ?Neurological:  ?   Mental Status: He is alert and oriented to person, place, and time. Mental status is at baseline.  ?Psychiatric:     ?   Behavior: Behavior normal.  ? ? ?BP 135/83   Pulse (!) 59   Ht '5\' 11"'$  (1.803 m)   Wt 230 lb (104.3 kg)   SpO2 98%   BMI 32.08 kg/m?  ?Wt Readings from Last 3 Encounters:  ?12/01/21 230 lb (104.3 kg)  ?10/23/20 225 lb (102.1 kg)  ?10/15/19 222 lb  (100.7 kg)  ? ? ? ?Health Maintenance Due  ?Topic Date Due  ? HIV Screening  Never done  ? COVID-19 Vaccine (3 - Pfizer risk series) 02/10/2020  ? ? ?There are no preventive care reminders to display for this patient. ? ?Lab Results  ?Component Value Date  ? TSH 5.06 (H) 10/23/2020  ? ?Lab Results  ?Component Value Date  ? WBC 8.8 11/17/2020  ? HGB 15.6 11/17/2020  ? HCT 46.0 11/17/2020  ? MCV 89.7 11/17/2020  ? PLT 388 11/17/2020  ? ?Lab Results  ?Component Value Date  ? NA 141 10/23/2020  ? K 4.5 10/23/2020  ? CO2 24 10/23/2020  ? GLUCOSE 82 10/23/2020  ? BUN 18 10/23/2020  ? CREATININE 1.28 10/23/2020  ? BILITOT 0.5 10/06/2020  ? ALKPHOS 31 (L) 09/06/2016  ? AST 25 10/06/2020  ? ALT 41 10/06/2020  ? PROT 6.9 10/06/2020  ? ALBUMIN 4.4 09/06/2016  ? CALCIUM 9.9 10/23/2020  ? ?Lab Results  ?Component Value Date  ? CHOL 169 09/10/2019  ? ?Lab Results  ?Component Value Date  ? HDL 37 (L) 09/10/2019  ? ?Lab Results  ?Component Value Date  ? LDLCALC 101 (H) 09/10/2019  ? ?Lab Results  ?Component Value Date  ? TRIG 185 (H) 09/10/2019  ? ?Lab Results  ?Component Value Date  ? CHOLHDL 4.6 09/10/2019  ? ?No results found for: HGBA1C ? ?  ?Assessment & Plan:  ? ?Problem List Items Addressed This Visit   ? ?  ? Cardiovascular and Mediastinum  ? Essential hypertension, benign (Chronic)  ?  Well controlled. Continue current regimen. Follow up in  6 mo  ?  ?  ? Relevant Medications  ? fenofibrate 160 MG tablet  ? hydrochlorothiazide (HYDRODIURIL) 25 MG tablet  ? losartan (COZAAR) 50 MG tablet  ? Other Relevant Orders  ? COMPLETE METABOLIC PANEL WITH GFR  ? Lipid panel  ?  ? Other  ? Hypertriglyceridemia - Primary (Chronic)  ?  Due to recheck lipids.  ?  ?  ? Relevant Medications  ? fenofibrate 160 MG tablet  ? hydrochlorothiazide (HYDRODIURIL) 25 MG tablet  ? losartan (COZAAR) 50 MG tablet  ? Other Relevant Orders  ? COMPLETE METABOLIC PANEL WITH GFR  ? Lipid panel  ? Benign prostatic hyperplasia with nocturia  ?  Due for  recheck PSA ?  ?  ? ?Other Visit Diagnoses   ? ?  Need for Zostavax administration      ? Relevant Orders  ? Varicella-zoster vaccine IM (Shingrix) (Completed)  ? Need for immunization against influenza      ? Relevant Orders  ? Flu Vaccine QUAD 73moIM (Fluarix, Fluzone & Alfiuria Quad PF) (Completed)  ? Rib pain on left side      ? Relevant Orders  ? DG Ribs Unilateral Left  ? Special screening, prostate cancer      ? Relevant Orders  ? PSA  ? Sciatica, right side      ? ?  ? ?Left-sided rib pain-he is tender directly over the ribs but its been an ongoing issue for well over a year.  Suspect it may just be like a peripheral nerve issue.  But we discussed going ahead and getting just a plain rib film just to rule out any underlying lesions such as lytic lesions etc.  I really do not think he has any type of fracture. ? ?Right-sided sciatica-could be secondary to piriformis inflammation and/or coming from his lumbar spine he does have a history of chronic recurrent low back pain.  We will give him him some exercises to do at home on his own.  It sounds like it is gradually getting a little better with just the ibuprofen so continue current regimen and if not completely resolving in the next week or 2 then please let uKoreaknow. ? ? ?Meds ordered this encounter  ?Medications  ? fenofibrate 160 MG tablet  ?  Sig: Take 1 tablet (160 mg total) by mouth daily.  ?  Dispense:  90 tablet  ?  Refill:  3  ? hydrochlorothiazide (HYDRODIURIL) 25 MG tablet  ?  Sig: Take 1 tablet (25 mg total) by mouth daily.  ?  Dispense:  90 tablet  ?  Refill:  3  ? losartan (COZAAR) 50 MG tablet  ?  Sig: Take 2 tablets (100 mg total) by mouth daily.  ?  Dispense:  180 tablet  ?  Refill:  1  ? ? ?Follow-up: Return in about 6 months (around 06/03/2022) for Hypertension.  ? ? ?CBeatrice Lecher MD ?

## 2021-12-01 NOTE — Assessment & Plan Note (Signed)
Well controlled. Continue current regimen. Follow up in  6 mo  

## 2021-12-01 NOTE — Assessment & Plan Note (Signed)
Due for recheck PSA ?

## 2021-12-02 DIAGNOSIS — H2512 Age-related nuclear cataract, left eye: Secondary | ICD-10-CM | POA: Diagnosis not present

## 2021-12-02 DIAGNOSIS — H25812 Combined forms of age-related cataract, left eye: Secondary | ICD-10-CM | POA: Diagnosis not present

## 2021-12-02 LAB — COMPLETE METABOLIC PANEL WITH GFR
AG Ratio: 1.8 (calc) (ref 1.0–2.5)
ALT: 31 U/L (ref 9–46)
AST: 19 U/L (ref 10–35)
Albumin: 4.4 g/dL (ref 3.6–5.1)
Alkaline phosphatase (APISO): 42 U/L (ref 35–144)
BUN: 14 mg/dL (ref 7–25)
CO2: 26 mmol/L (ref 20–32)
Calcium: 9.2 mg/dL (ref 8.6–10.3)
Chloride: 107 mmol/L (ref 98–110)
Creat: 1.14 mg/dL (ref 0.70–1.35)
Globulin: 2.5 g/dL (calc) (ref 1.9–3.7)
Glucose, Bld: 80 mg/dL (ref 65–99)
Potassium: 4.9 mmol/L (ref 3.5–5.3)
Sodium: 142 mmol/L (ref 135–146)
Total Bilirubin: 0.4 mg/dL (ref 0.2–1.2)
Total Protein: 6.9 g/dL (ref 6.1–8.1)
eGFR: 74 mL/min/{1.73_m2} (ref >=60)

## 2021-12-02 LAB — LIPID PANEL
Cholesterol: 188 mg/dL (ref <200)
HDL: 37 mg/dL — ABNORMAL LOW (ref >=40)
LDL Cholesterol (Calc): 116 mg/dL (calc) — ABNORMAL HIGH
Non-HDL Cholesterol (Calc): 151 mg/dL (calc) — ABNORMAL HIGH (ref <130)
Total CHOL/HDL Ratio: 5.1 (calc) — ABNORMAL HIGH (ref <5.0)
Triglycerides: 229 mg/dL — ABNORMAL HIGH (ref <150)

## 2021-12-02 LAB — PSA: PSA: 1.56 ng/mL (ref <=4.00)

## 2021-12-03 ENCOUNTER — Other Ambulatory Visit: Payer: Self-pay

## 2021-12-03 MED ORDER — ATORVASTATIN CALCIUM 40 MG PO TABS: 40.0000 mg | ORAL_TABLET | Freq: Every day | ORAL | 3 refills | Status: DC

## 2021-12-03 NOTE — Addendum Note (Signed)
Addended by: Beatrice Lecher D on: 12/03/2021 09:33 AM ? ? Modules accepted: Orders ? ?

## 2021-12-03 NOTE — Progress Notes (Signed)
Hi Jeremy House, LDL cholesterol is up compared to 2 years ago.  Your 10-year cardiovascular risk score is greater than 10% which puts you into a very high risk category for heart attack and stroke.  Current guidelines and recommendations show that being on a statin, especially if your risk is 10% or higher, significantly reduces the chances of you having a heart attack or stroke.  I would highly recommend that you consider starting a statin at bedtime.  If you are okay with this then please let me know. ? ?Metabolic panel and prostate test are normal. ? ?The 10-year ASCVD risk score (Arnett DK, et al., 2019) is: 11.4% ?  Values used to calculate the score: ?    Age: 61 years ?    Sex: Male ?    Is Non-Hispanic African American: No ?    Diabetic: No ?    Tobacco smoker: No ?    Systolic Blood Pressure: 194 mmHg ?    Is BP treated: Yes ?    HDL Cholesterol: 37 mg/dL ?    Total Cholesterol: 188 mg/dL ?

## 2021-12-03 NOTE — Progress Notes (Signed)
HI Jeremy House,  ?We will go ahead and start the statin at bedtime.  Make sure that you are taking the fenofibrate in the morning.  You do not want to take them close together or can increase your risk for muscle aches.  Most of the time the statin is actually more powerful than the fenofibrate so we may find that we can actually discontinue that fenofibrate.  Or you can even hold it while you are taking the statin if you want and then we can recheck your levels in 12 weeks with just the statin. ? ?Orders Placed This Encounter ?    fenofibrate 160 MG tablet ?        Sig: Take 1 tablet (160 mg total) by mouth daily. ?        Dispense:  90 tablet ?        Refill:  3 ?    hydrochlorothiazide (HYDRODIURIL) 25 MG tablet ?        Sig: Take 1 tablet (25 mg total) by mouth daily. ?        Dispense:  90 tablet ?        Refill:  3 ?    losartan (COZAAR) 50 MG tablet ?        Sig: Take 2 tablets (100 mg total) by mouth daily. ?        Dispense:  180 tablet ?        Refill:  1 ?    atorvastatin (LIPITOR) 40 MG tablet ?        Sig: Take 1 tablet (40 mg total) by mouth at bedtime. ?        Dispense:  90 tablet ?        Refill:  3 ? ?

## 2021-12-06 NOTE — Progress Notes (Signed)
Hi Jeremy House, your ribs looks OK, so I don't know why you are so tender unless the nerve there is inflammed.  I would continue to watch it.  It it worsens or you need something to reduce the pain please let us know.

## 2021-12-09 ENCOUNTER — Telehealth: Payer: Self-pay

## 2021-12-09 MED ORDER — PREDNISONE 20 MG PO TABS: 40.0000 mg | ORAL_TABLET | Freq: Every day | ORAL | 0 refills | Status: DC

## 2021-12-09 MED ORDER — CYCLOBENZAPRINE HCL 10 MG PO TABS: 10.0000 mg | ORAL_TABLET | Freq: Three times a day (TID) | ORAL | 0 refills | Status: DC | PRN

## 2021-12-09 NOTE — Telephone Encounter (Signed)
Patient called stating he is still having sciatica pain on right side. Patient stated he is doing the exercises and the pain is getting worse. Patient would like to know if there is anything else he can do for the pain?  ?

## 2021-12-09 NOTE — Telephone Encounter (Signed)
Lets schedule with Dr. Darene Lamer or can get in with sports med at our [place] location.,  ? ?Sent over steroid and muscle relaxer for the weekend.  ? ?Meds ordered this encounter  ?Medications  ? predniSONE (DELTASONE) 20 MG tablet  ?  Sig: Take 2 tablets (40 mg total) by mouth daily with breakfast.  ?  Dispense:  10 tablet  ?  Refill:  0  ? cyclobenzaprine (FLEXERIL) 10 MG tablet  ?  Sig: Take 1 tablet (10 mg total) by mouth 3 (three) times daily as needed for muscle spasms.  ?  Dispense:  30 tablet  ?  Refill:  0  ? ? ?

## 2021-12-10 NOTE — Telephone Encounter (Signed)
Patient advised. He will call back to schedule.  ?

## 2022-02-24 IMAGING — DX DG CHEST 2V
2 series · 2 of 2 positions shown · non-contrast
Comparison: None.

CLINICAL DATA: Cough and shortness of breath

EXAM:
CHEST - 2 VIEW

[chest pa]
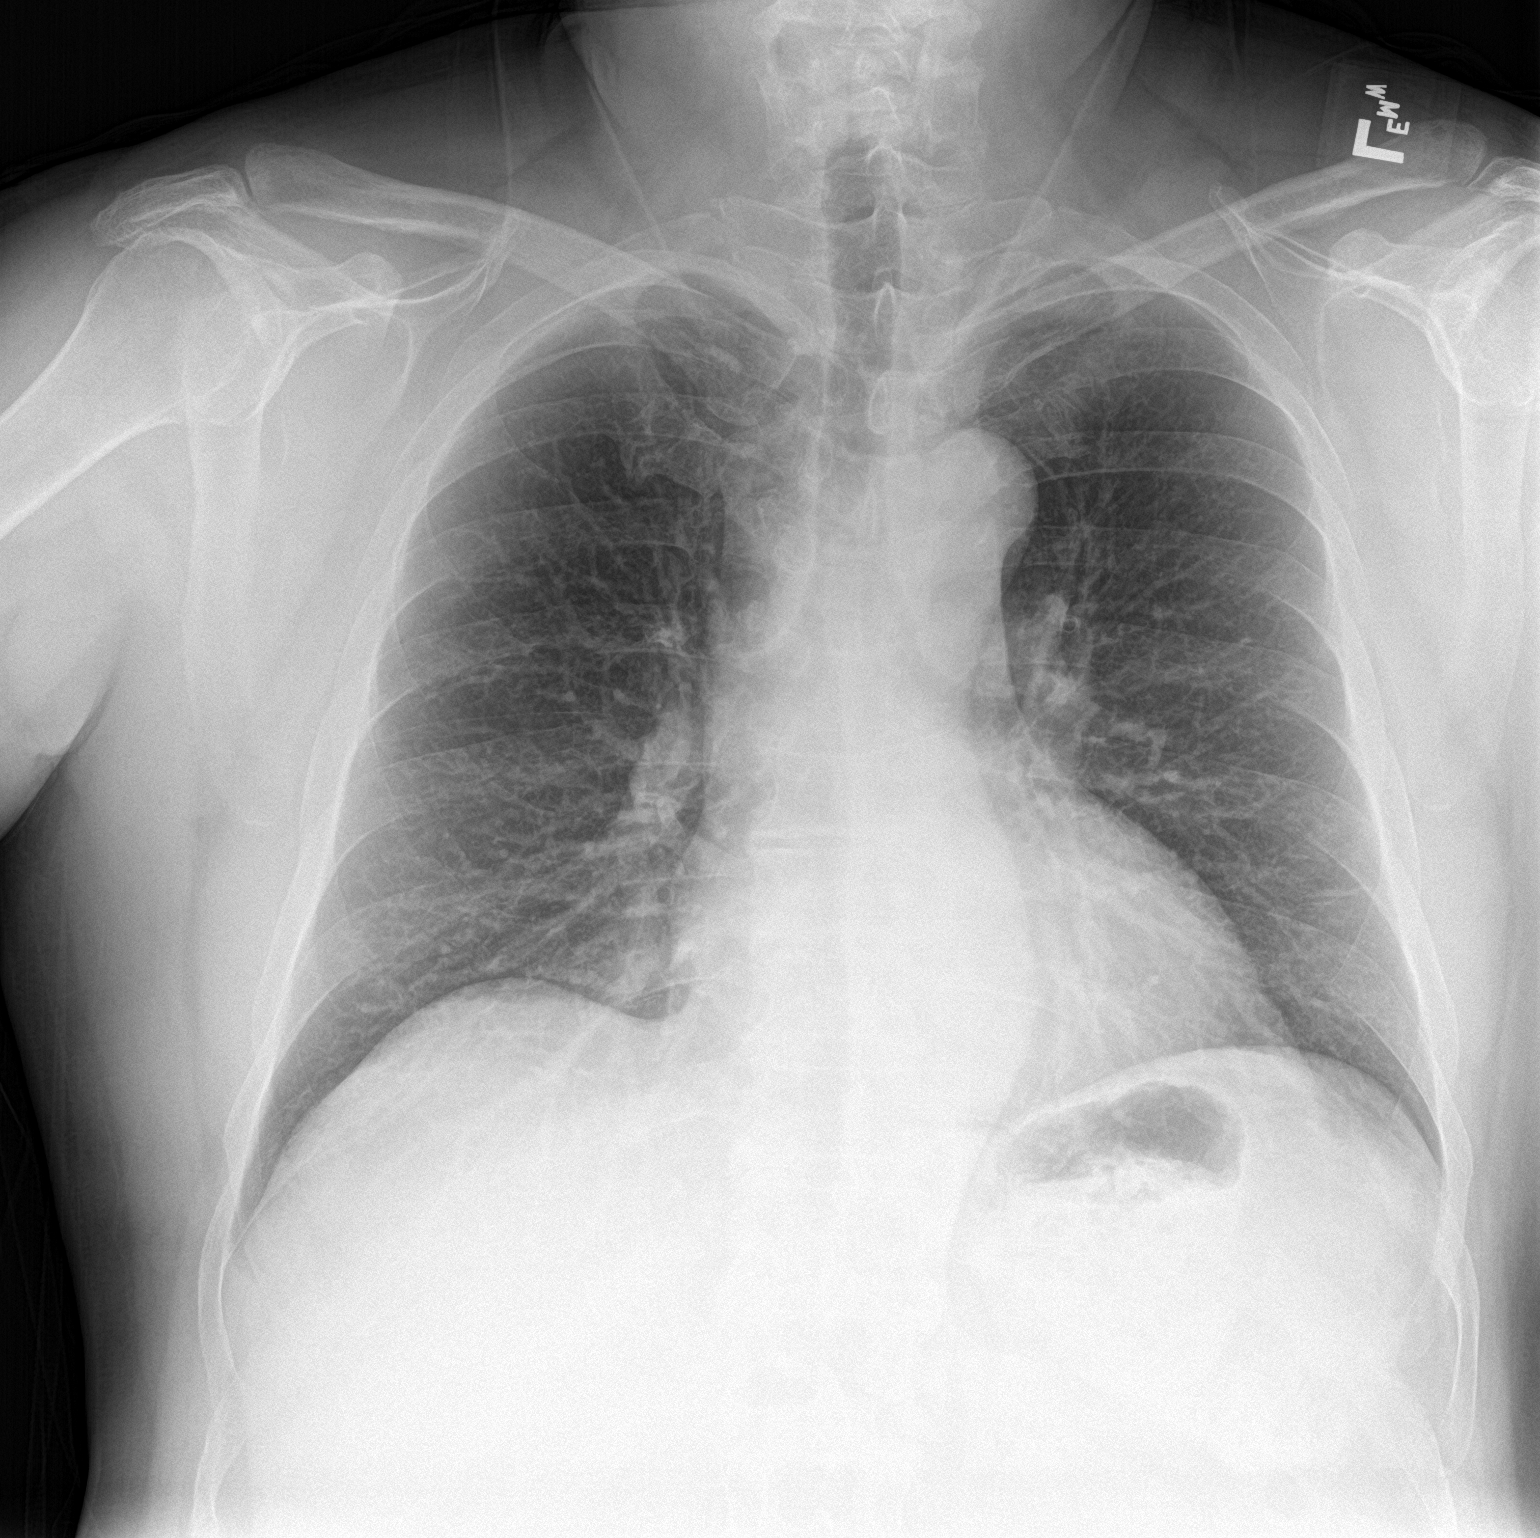

[chest lat]
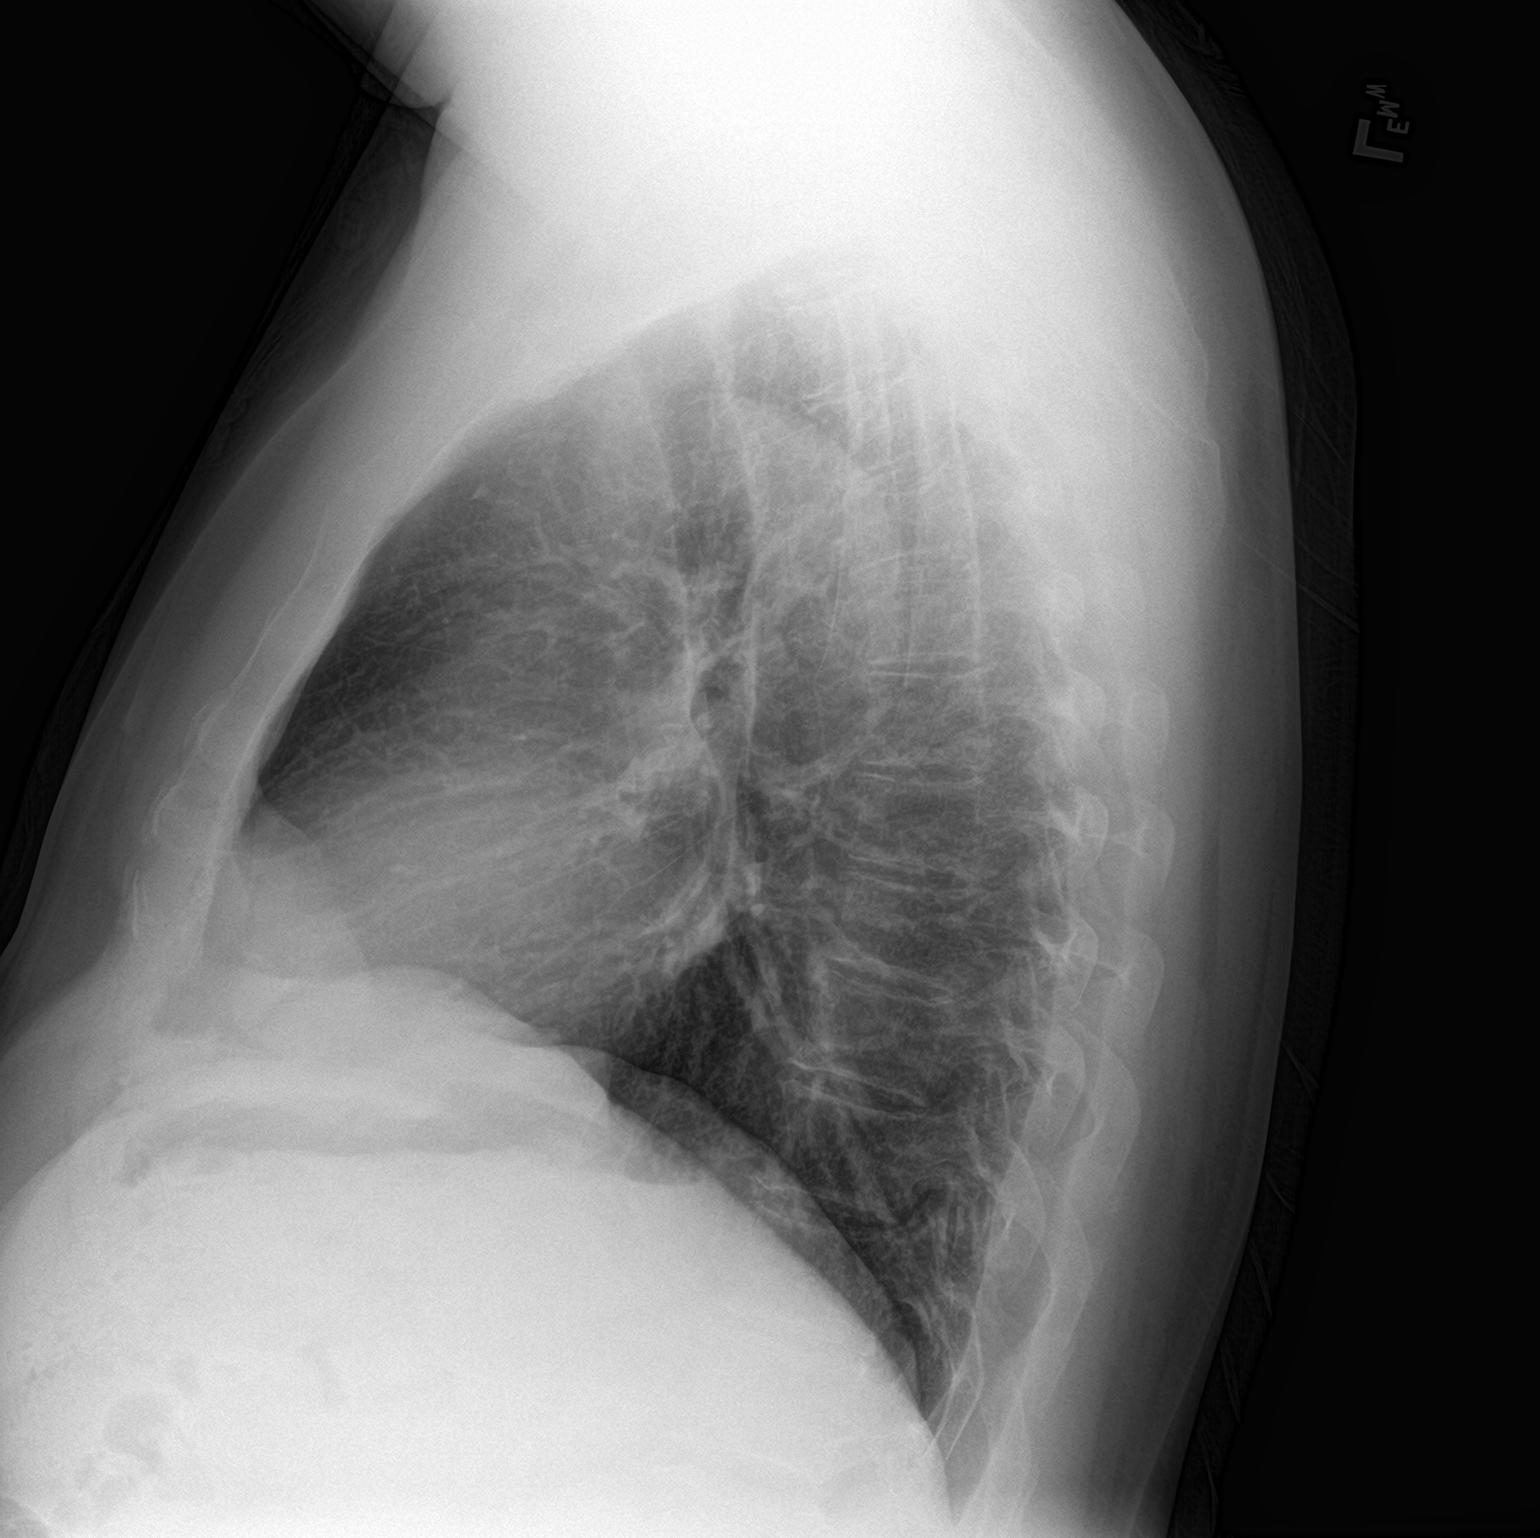

[2 of 2 positions shown; findings below may reference images not displayed]

FINDINGS: The lungs are clear. The heart size and pulmonary vascularity are
normal. No adenopathy. No bone lesions.
IMPRESSION: Lungs clear.  Cardiac silhouette normal.

## 2022-04-05 ENCOUNTER — Other Ambulatory Visit: Payer: Self-pay | Admitting: Family Medicine

## 2022-04-20 ENCOUNTER — Other Ambulatory Visit: Payer: Self-pay | Admitting: Family Medicine

## 2022-04-20 DIAGNOSIS — I10 Essential (primary) hypertension: Secondary | ICD-10-CM

## 2022-05-17 ENCOUNTER — Telehealth: Payer: Self-pay

## 2022-05-17 NOTE — Telephone Encounter (Signed)
Patient left a vm msg on medical assistant's line. Per patient, has been having a headache since Saturday. Patient has been taking ibuprofen as needed, but still continues with headaches. Patient is also having elevated blood pressure.   Please contact the patient for scheduling a same day/acute appointment. Thanks in advance.

## 2022-05-17 NOTE — Telephone Encounter (Signed)
We got him scheduled for tomorrow at 10:00- tvt

## 2022-05-18 ENCOUNTER — Ambulatory Visit: Payer: BC Managed Care – PPO | Admitting: Family Medicine

## 2022-05-18 ENCOUNTER — Encounter: Payer: Self-pay | Admitting: Family Medicine

## 2022-05-18 VITALS — BP 100/81 | HR 79 | Temp 98.3°F | Ht 71.0 in | Wt 226.0 lb

## 2022-05-18 DIAGNOSIS — I10 Essential (primary) hypertension: Secondary | ICD-10-CM

## 2022-05-18 DIAGNOSIS — Z23 Encounter for immunization: Secondary | ICD-10-CM | POA: Diagnosis not present

## 2022-05-18 DIAGNOSIS — G43811 Other migraine, intractable, with status migrainosus: Secondary | ICD-10-CM

## 2022-05-18 DIAGNOSIS — R0683 Snoring: Secondary | ICD-10-CM | POA: Diagnosis not present

## 2022-05-18 MED ORDER — TOPIRAMATE 25 MG PO TABS: 25.0000 mg | ORAL_TABLET | Freq: Two times a day (BID) | ORAL | 1 refills | Status: DC

## 2022-05-18 MED ORDER — PREDNISONE 10 MG PO TABS: ORAL_TABLET | ORAL | 0 refills | Status: AC

## 2022-05-18 NOTE — Assessment & Plan Note (Signed)
Go ahead and move forward with getting sleep study ordered I think you be a great candidate for home sleep study this could even be contributing to his blood pressure and headaches as well.

## 2022-05-18 NOTE — Progress Notes (Signed)
Established Patient Office Visit  Subjective   Patient ID: Jeremy House, male    DOB: 03-04-1961  Age: 61 y.o. MRN: [id number]  Chief Complaint  Patient presents with   Headache    HPI  Hypertension- Pt denies chest pain, SOB, dizziness, or heart palpitations.  Taking meds as directed w/o problems.  Denies medication side effects.    Headaches have been infrequent but sporadic.  He is having 2-3 headaches per week on average.  He says this last 1 started on Saturday and has not let up.  Usually he can get some relief with Excedrin Migraine.  He said he took 5 tabs on Saturday but he says the worst headache was actually last night.  He feels a little bit better this morning but the headache is still present it is mostly frontal.  Most the time it is more right-sided or left-sided but right now it is more frontal he does have some intermittent nausea.  No sound or light sensitivity.  No vomiting.  No other neurologic symptoms such as paresthesias.  Snoring. Would like to move forward with sleep study.     ROS    Objective:     BP 100/81 (BP Location: Left Arm, Patient Position: Sitting, Cuff Size: Large)   Pulse 79   Temp 98.3 F (36.8 C) (Oral)   Ht '5\' 11"'$  (1.803 m)   Wt 226 lb 0.6 oz (102.5 kg)   SpO2 98%   BMI 31.53 kg/m     Physical Exam Constitutional:      Appearance: He is well-developed.  HENT:     Head: Normocephalic and atraumatic.  Cardiovascular:     Rate and Rhythm: Normal rate and regular rhythm.     Heart sounds: Normal heart sounds.  Pulmonary:     Effort: Pulmonary effort is normal.     Breath sounds: Normal breath sounds.  Skin:    General: Skin is warm and dry.  Neurological:     Mental Status: He is alert and oriented to person, place, and time.  Psychiatric:        Behavior: Behavior normal.      No results found for any visits on 05/18/22.    The 10-year ASCVD risk score (Arnett DK, et al., 2019) is: 8.3%    Assessment & Plan:    Problem List Items Addressed This Visit       Cardiovascular and Mediastinum   Essential hypertension, benign - Primary (Chronic)    blood pressure is definitely on the lower end today though he was getting higher readings over the weekend but he was also taking multiple doses of Excedrin Migraine and also in pain from the headaches.  Encouraged him to check it at home he is also welcome to bring in his home blood pressure cuff to compare to ours.  If he really is getting some lower blood pressures then we may need to decrease his blood pressure pill.      Migraine    Discussed options and like to treat him with a prednisone taper to try to break his current headache cycle that he has been in for the last 3 days.  Also will need to avoid any over-the-counter pain relievers or headache medications during that time.  I would also like to go ahead and start him on prophylaxis with Topamax and then see him back in 2 months and see how he is doing.      Relevant Medications  topiramate (TOPAMAX) 25 MG tablet     Other   Snoring    Go ahead and move forward with getting sleep study ordered I think you be a great candidate for home sleep study this could even be contributing to his blood pressure and headaches as well.      Relevant Orders   Home sleep test   Other Visit Diagnoses     Need for influenza vaccination       Relevant Orders   Flu Vaccine QUAD 6+ mos PF IM (Fluarix Quad PF) (Completed)   Need for shingles vaccine       Relevant Orders   Zoster Recombinant (Shingrix ) (Completed)       Snoring-we will go ahead and have him complete a stop bang and place referral for home sleep study.  Stop bang score of 7  Return in about 7 weeks (around 07/06/2022) for migraines, your welcome to bring BP cuff in.    Beatrice Lecher, MD

## 2022-05-18 NOTE — Assessment & Plan Note (Signed)
blood pressure is definitely on the lower end today though he was getting higher readings over the weekend but he was also taking multiple doses of Excedrin Migraine and also in pain from the headaches.  Encouraged him to check it at home he is also welcome to bring in his home blood pressure cuff to compare to ours.  If he really is getting some lower blood pressures then we may need to decrease his blood pressure pill.

## 2022-05-18 NOTE — Assessment & Plan Note (Signed)
Discussed options and like to treat him with a prednisone taper to try to break his current headache cycle that he has been in for the last 3 days.  Also will need to avoid any over-the-counter pain relievers or headache medications during that time.  I would also like to go ahead and start him on prophylaxis with Topamax and then see him back in 2 months and see how he is doing.

## 2022-05-31 ENCOUNTER — Telehealth: Payer: Self-pay

## 2022-05-31 DIAGNOSIS — R0683 Snoring: Secondary | ICD-10-CM

## 2022-05-31 DIAGNOSIS — G43811 Other migraine, intractable, with status migrainosus: Secondary | ICD-10-CM

## 2022-05-31 NOTE — Telephone Encounter (Signed)
Pt called in regards to his sleep study , pt states she hasn't herd anything back from the referral team, a sleep study was placed on my end, pt also had a question in regards to the medication topiramate (TOPAMAX) 25 mg, pt states his migraines have not subsided and continue  for hours at a time with no relief, pt denied nausea vomiting and has not checked his BP since his last visit , pt also states he has not been taking Excedrin since he was prescribed topamax

## 2022-06-01 ENCOUNTER — Ambulatory Visit: Payer: BC Managed Care – PPO | Admitting: Physician Assistant

## 2022-06-01 NOTE — Telephone Encounter (Signed)
Jeremy House agreed to the MRI. He states the prednisone did give him relief at the end of taking the prednisone.

## 2022-06-01 NOTE — Addendum Note (Signed)
Addended by: Beatrice Lecher D on: 06/01/2022 02:13 PM   Modules accepted: Orders

## 2022-06-01 NOTE — Addendum Note (Signed)
Addended by: Narda Rutherford on: 06/01/2022 09:46 AM   Modules accepted: Orders

## 2022-06-01 NOTE — Telephone Encounter (Signed)
Okay, if he still having a headache from when I last saw him that I think we should do a brain MRI.  Did the prednisone taper that we gave him helped for a short period of time?  Did he get any relief at all?  Please have him continue with the Topamax.

## 2022-06-01 NOTE — Telephone Encounter (Signed)
Jeremy House called back and left a message for a return call. I called and left a message stating we ordered the Sleep Study again in hopes to get him scheduled soon and that he may need to come back in for a visit sooner than 7 weeks.

## 2022-06-01 NOTE — Addendum Note (Signed)
Addended by: Beatrice Lecher D on: 06/01/2022 02:11 PM   Modules accepted: Orders

## 2022-06-01 NOTE — Telephone Encounter (Signed)
Jeremy House called and reports pressure in the front of his head daily. The headache stays most of the day. The pain is moderate. The pain is about 6/10. H has some slight nausea off and on. He is taking the Topamax twice daily without any noticeable relief.   Home blood pressure is around 125/85

## 2022-06-03 ENCOUNTER — Ambulatory Visit: Payer: BC Managed Care – PPO | Admitting: Family Medicine

## 2022-06-03 MED ORDER — PREDNISONE 20 MG PO TABS: 40.0000 mg | ORAL_TABLET | Freq: Every day | ORAL | 0 refills | Status: DC

## 2022-06-03 MED ORDER — TOPIRAMATE 50 MG PO TABS: 50.0000 mg | ORAL_TABLET | Freq: Two times a day (BID) | ORAL | 0 refills | Status: DC

## 2022-06-03 NOTE — Telephone Encounter (Signed)
Brain MRI scheduled.  I did go ahead and send over an increased dose of the topiramate for him to start 50 mg twice a day.  I also sent over a 5-day prednisone burst instead of a taper since it does sound like he got a little bit of relief with that.

## 2022-06-03 NOTE — Addendum Note (Signed)
Addended by: Beatrice Lecher D on: 06/03/2022 02:36 PM   Modules accepted: Orders

## 2022-06-03 NOTE — Telephone Encounter (Signed)
Task completed. Patient has been updated. He will stop by the pharmacy for the prednisone rx. Patient will start taking topiramate as recommended by the provider. Patient has been scheduled for his MRI on 06/07/22.

## 2022-06-07 ENCOUNTER — Ambulatory Visit (INDEPENDENT_AMBULATORY_CARE_PROVIDER_SITE_OTHER): Payer: BC Managed Care – PPO

## 2022-06-07 ENCOUNTER — Other Ambulatory Visit: Payer: BC Managed Care – PPO

## 2022-06-07 DIAGNOSIS — G43909 Migraine, unspecified, not intractable, without status migrainosus: Secondary | ICD-10-CM | POA: Diagnosis not present

## 2022-06-07 DIAGNOSIS — G43811 Other migraine, intractable, with status migrainosus: Secondary | ICD-10-CM | POA: Diagnosis not present

## 2022-06-07 DIAGNOSIS — R519 Headache, unspecified: Secondary | ICD-10-CM | POA: Diagnosis not present

## 2022-06-07 MED ORDER — GADOPICLENOL 0.5 MMOL/ML IV SOLN
10.0000 mL | Freq: Once | INTRAVENOUS | 0 refills | Status: DC | PRN
Start: 2022-06-07 — End: 2022-06-07

## 2022-06-07 MED ORDER — GADOPICLENOL 0.5 MMOL/ML IV SOLN
10.0000 mL | Freq: Once | INTRAVENOUS | Status: AC | PRN
  Administered 2022-06-07: 10 mL via INTRAVENOUS

## 2022-06-08 ENCOUNTER — Encounter: Payer: Self-pay | Admitting: Family Medicine

## 2022-06-08 NOTE — Progress Notes (Signed)
Hi [name], MRI looks very reassuring.  They did not find any sign of mass or stroke or abnormal blood flow issues.  No sinus issues so nothing specific to explain the increase in headaches.  I did send an increased dose on the Topamax to 50 mg twice a day a few days ago so hopefully you are able to pick that up and get started.  We may need to even go up a little higher but we will try that for a few weeks and see if you feel like you are starting to get some relief.  That is not helpful and we do have some other options.  Please try to follow-up with me in about 3 to 4 weeks.

## 2022-06-20 ENCOUNTER — Telehealth: Payer: Self-pay | Admitting: Neurology

## 2022-06-20 MED ORDER — AMITRIPTYLINE HCL 25 MG PO TABS: 25.0000 mg | ORAL_TABLET | Freq: Every day | ORAL | 0 refills | Status: DC

## 2022-06-20 NOTE — Telephone Encounter (Signed)
Patient left vm stating he is taking the Topamax 50 mg BID. He is still having headaches and feels this is making no difference. He also states he is having the following symptoms which may be side effects: nausea (worse), brain fog, can't concentrate, dizziness, pain in the base of neck, and fatigues easily. He has appt 07/07/2022 but doesn't know if there is something he should be doing in the interim. Please advise.

## 2022-06-20 NOTE — Telephone Encounter (Signed)
OK. Have him dec topamax to once a day for 6 days and then stop. Wiil start  amitriptyline to start at night. Can start that now.  Also have him check his BP at home. Was a little low when he was here last time. I want to see if still low or if back to normal.

## 2022-06-21 NOTE — Telephone Encounter (Signed)
Spoke with patient and advised of recommendations. He will change medications and call if needed before his appt in two weeks. He does want to discuss at this appt the possibility of the headaches stemming from the pain in his neck. Dr. Madilyn Fireman - FYI.

## 2022-06-23 NOTE — Telephone Encounter (Signed)
Patient made aware. He is feeling run down and can't work. I placed him on schedule tomorrow for an evaluation. Dr. Madilyn Fireman - FYI.

## 2022-06-23 NOTE — Telephone Encounter (Signed)
Okay, please have him cut his hydrochlorothiazide in half or even hold completely and then recheck blood pressure couple times over the weekend.  I want to see if we let his blood pressure come up a little bit if he feels better as well.

## 2022-06-23 NOTE — Telephone Encounter (Signed)
Patient called back, still having some fatigue, but just called to report his blood pressure reading which was 109-76 yesterday. Dr. Madilyn Fireman - FYI.

## 2022-06-24 ENCOUNTER — Ambulatory Visit: Payer: BC Managed Care – PPO | Admitting: Family Medicine

## 2022-06-24 ENCOUNTER — Ambulatory Visit (INDEPENDENT_AMBULATORY_CARE_PROVIDER_SITE_OTHER): Payer: BC Managed Care – PPO

## 2022-06-24 ENCOUNTER — Encounter: Payer: Self-pay | Admitting: Family Medicine

## 2022-06-24 VITALS — BP 117/77 | HR 90 | Temp 98.6°F | Ht 71.0 in | Wt 221.0 lb

## 2022-06-24 DIAGNOSIS — R110 Nausea: Secondary | ICD-10-CM | POA: Diagnosis not present

## 2022-06-24 DIAGNOSIS — M791 Myalgia, unspecified site: Secondary | ICD-10-CM | POA: Diagnosis not present

## 2022-06-24 DIAGNOSIS — R0602 Shortness of breath: Secondary | ICD-10-CM | POA: Diagnosis not present

## 2022-06-24 DIAGNOSIS — M542 Cervicalgia: Secondary | ICD-10-CM

## 2022-06-24 DIAGNOSIS — I10 Essential (primary) hypertension: Secondary | ICD-10-CM

## 2022-06-24 DIAGNOSIS — K921 Melena: Secondary | ICD-10-CM

## 2022-06-24 DIAGNOSIS — G43811 Other migraine, intractable, with status migrainosus: Secondary | ICD-10-CM | POA: Diagnosis not present

## 2022-06-24 DIAGNOSIS — R42 Dizziness and giddiness: Secondary | ICD-10-CM

## 2022-06-24 LAB — POCT INFLUENZA A/B
Influenza A, POC: NEGATIVE
Influenza B, POC: NEGATIVE

## 2022-06-24 LAB — POC COVID19 BINAXNOW: SARS Coronavirus 2 Ag: NEGATIVE

## 2022-06-24 NOTE — Progress Notes (Signed)
Established Patient Office Visit  Subjective   Patient ID: Jeremy House, male    DOB: 04-16-1961  Age: 61 y.o. MRN: [id number]  Chief Complaint  Patient presents with   Fatigue    C/o  dizziness, nausea, stiff neck, bodyaches, constant headaches, brain fog,  slurred speech ,  SOB with steps that he did not have SOB when taken previously,  difficulty sleeping , increase urination-  symptoms ranging from 3 to 6 weeks =pt has taken 3 home COVID test that were all negative ( last taken 3 to 4 weeks ago) - he states the HCTZ 25 mg is now taken as 1/2 tablet.     HPI  Here today for follow-up on headaches.  I saw him about 6 weeks ago for new onset frequent headaches that have been cycling.  At the time we did do an MRI which was negative.  Started him on Topamax.  He was not improving so we increased the Topamax to 50 mg.  He is developed some new symptoms since I last saw him.  Now he is experiencing dizziness it is worse with change in movement or motion.  He is also had some shortness of breath for the last couple of days.  He thinks he may have had a low-grade temperature a week ago.  He did travel to [place] back in August and actually traveled to [place] last week. C/o  dizziness, nausea, stiff neck, bodyaches, constant headaches, brain fog,  slurred speech,  SOB with steps that he did not have SOB when taken previously,  difficulty sleeping , increase urination-  pt has taken 3 home COVID test that were all negative ( last taken 3 to 4 weeks ago).  He also reports that he had a cough couple of stools about a week ago where he noticed a little bit of bright red blood.  He thought maybe it was just his hemorrhoids that has not happened since then.  He says it just feels like when you have the flu when you just do not feel good and you are achy all over.   Pressures also recently were low here in the office and at home so had encouraged him to cut his blood pressure pill in half.  he states the HCTZ  25 mg is now taken as 1/2 tablet.  Interestingly he has a history of spinal meningitis at the age of 6 months.     ROS    Objective:     BP 117/77   Pulse 90   Temp 98.6 F (37 C)   Ht 5\' 11"  (1.803 m)   Wt 221 lb (100.2 kg)   SpO2 97%   BMI 30.82 kg/m     Physical Exam Constitutional:      Appearance: He is well-developed.  HENT:     Head: Normocephalic and atraumatic.     Right Ear: External ear normal.     Left Ear: External ear normal.     Nose: Nose normal.  Eyes:     Conjunctiva/sclera: Conjunctivae normal.     Pupils: Pupils are equal, round, and reactive to light.  Neck:     Thyroid: No thyromegaly.  Cardiovascular:     Rate and Rhythm: Normal rate and regular rhythm.     Heart sounds: Normal heart sounds.  Pulmonary:     Effort: Pulmonary effort is normal.     Breath sounds: Normal breath sounds.  Musculoskeletal:     Cervical back: Neck  supple.  Lymphadenopathy:     Cervical: No cervical adenopathy.  Skin:    General: Skin is warm and dry.  Neurological:     Mental Status: He is alert and oriented to person, place, and time.  Psychiatric:        Behavior: Behavior normal.      Results for orders placed or performed in visit on 06/24/22  Sedimentation rate  Result Value Ref Range   Sed Rate 25 (H) 0 - 20 mm/h  C-reactive protein  Result Value Ref Range   CRP 22.8 (H) <8.0 mg/L  COMPLETE METABOLIC PANEL WITH GFR  Result Value Ref Range   Glucose, Bld 87 65 - 99 mg/dL   BUN 18 7 - 25 mg/dL   Creat 8.29 5.62 - 1.30 mg/dL   eGFR 69 > OR = 60 QM/VHQ/4.69G2   BUN/Creatinine Ratio SEE NOTE: 6 - 22 (calc)   Sodium 142 135 - 146 mmol/L   Potassium 4.0 3.5 - 5.3 mmol/L   Chloride 106 98 - 110 mmol/L   CO2 26 20 - 32 mmol/L   Calcium 9.4 8.6 - 10.3 mg/dL   Total Protein 7.3 6.1 - 8.1 g/dL   Albumin 4.6 3.6 - 5.1 g/dL   Globulin 2.7 1.9 - 3.7 g/dL (calc)   AG Ratio 1.7 1.0 - 2.5 (calc)   Total Bilirubin 0.4 0.2 - 1.2 mg/dL   Alkaline  phosphatase (APISO) 67 35 - 144 U/L   AST 15 10 - 35 U/L   ALT 20 9 - 46 U/L  CBC with Differential/Platelet  Result Value Ref Range   WBC 9.5 3.8 - 10.8 Thousand/uL   RBC 4.85 4.20 - 5.80 Million/uL   Hemoglobin 14.9 13.2 - 17.1 g/dL   HCT 95.2 84.1 - 32.4 %   MCV 88.0 80.0 - 100.0 fL   MCH 30.7 27.0 - 33.0 pg   MCHC 34.9 32.0 - 36.0 g/dL   RDW 40.1 02.7 - 25.3 %   Platelets 280 140 - 400 Thousand/uL   MPV 10.3 7.5 - 12.5 fL   Neutro Abs 6,717 1,500 - 7,800 cells/uL   Lymphs Abs 1,568 850 - 3,900 cells/uL   Absolute Monocytes 903 200 - 950 cells/uL   Eosinophils Absolute 238 15 - 500 cells/uL   Basophils Absolute 76 0 - 200 cells/uL   Neutrophils Relative % 70.7 %   Total Lymphocyte 16.5 %   Monocytes Relative 9.5 %   Eosinophils Relative 2.5 %   Basophils Relative 0.8 %  Urinalysis, Routine w reflex microscopic  Result Value Ref Range   Color, Urine YELLOW YELLOW   APPearance CLEAR CLEAR   Specific Gravity, Urine 1.023 1.001 - 1.035   pH < OR = 5.0 5.0 - 8.0   Glucose, UA NEGATIVE NEGATIVE   Bilirubin Urine NEGATIVE NEGATIVE   Ketones, ur NEGATIVE NEGATIVE   Hgb urine dipstick NEGATIVE NEGATIVE   Protein, ur NEGATIVE NEGATIVE   Nitrite NEGATIVE NEGATIVE   Leukocytes,Ua NEGATIVE NEGATIVE  Epstein-Barr Virus VCA Antibody Panel  Result Value Ref Range   EBV VCA IgM <36.00 U/mL   EBV VCA IgG <18.00 U/mL   EBV NA IgG 181.00 (H) U/mL   Interpretation    CK (Creatine Kinase)  Result Value Ref Range   Total CK 90 44 - 196 U/L  TSH  Result Value Ref Range   TSH 4.69 (H) 0.40 - 4.50 mIU/L  POC COVID-19  Result Value Ref Range   SARS Coronavirus 2 Ag Negative  Negative  POCT Influenza A/B  Result Value Ref Range   Influenza A, POC Negative Negative   Influenza B, POC Negative Negative       The 10-year ASCVD risk score (Arnett DK, et al., 2019) is: 10.9%    Assessment & Plan:   Problem List Items Addressed This Visit       Cardiovascular and Mediastinum    Essential hypertension, benign (Chronic)   Relevant Orders   Sedimentation rate (Completed)   C-reactive protein (Completed)   COMPLETE METABOLIC PANEL WITH GFR (Completed)   CBC with Differential/Platelet (Completed)   Urinalysis, Routine w reflex microscopic (Completed)   CMV abs, IgG+IgM (cytomegalovirus)   Epstein-Barr Virus VCA Antibody Panel (Completed)   [place] Antibodies, IGG and IGM   CK (Creatine Kinase) (Completed)   TSH (Completed)   DG Chest 2 View   [place] Abs w/Reflex to Titer   Migraine - Primary   Relevant Orders   Sedimentation rate (Completed)   C-reactive protein (Completed)   COMPLETE METABOLIC PANEL WITH GFR (Completed)   CBC with Differential/Platelet (Completed)   Urinalysis, Routine w reflex microscopic (Completed)   CMV abs, IgG+IgM (cytomegalovirus)   Epstein-Barr Virus VCA Antibody Panel (Completed)   [place] Antibodies, IGG and IGM   CK (Creatine Kinase) (Completed)   TSH (Completed)   DG Chest 2 View   [place] Abs w/Reflex to Titer   Other Visit Diagnoses     Nausea       Relevant Orders   Sedimentation rate (Completed)   C-reactive protein (Completed)   COMPLETE METABOLIC PANEL WITH GFR (Completed)   CBC with Differential/Platelet (Completed)   Urinalysis, Routine w reflex microscopic (Completed)   CMV abs, IgG+IgM (cytomegalovirus)   Epstein-Barr Virus VCA Antibody Panel (Completed)   [place] Antibodies, IGG and IGM   CK (Creatine Kinase) (Completed)   TSH (Completed)   DG Chest 2 View   [place] Abs w/Reflex to Titer   POC COVID-19 (Completed)   POCT Influenza A/B (Completed)   Neck pain       Relevant Orders   Sedimentation rate (Completed)   C-reactive protein (Completed)   COMPLETE METABOLIC PANEL WITH GFR (Completed)   CBC with Differential/Platelet (Completed)   Urinalysis, Routine w reflex microscopic (Completed)   CMV abs, IgG+IgM (cytomegalovirus)   Epstein-Barr Virus VCA Antibody Panel (Completed)    [place] Antibodies, IGG and IGM   CK (Creatine Kinase) (Completed)   TSH (Completed)   DG Chest 2 View   [place] Abs w/Reflex to Titer   DG Cervical Spine Complete   Myalgia       Relevant Orders   Sedimentation rate (Completed)   C-reactive protein (Completed)   COMPLETE METABOLIC PANEL WITH GFR (Completed)   CBC with Differential/Platelet (Completed)   Urinalysis, Routine w reflex microscopic (Completed)   CMV abs, IgG+IgM (cytomegalovirus)   Epstein-Barr Virus VCA Antibody Panel (Completed)   [place] Antibodies, IGG and IGM   CK (Creatine Kinase) (Completed)   TSH (Completed)   DG Chest 2 View   [place] Abs w/Reflex to Titer   POC COVID-19 (Completed)   POCT Influenza A/B (Completed)   SOB (shortness of breath)       Relevant Orders   Sedimentation rate (Completed)   C-reactive protein (Completed)   COMPLETE METABOLIC PANEL WITH GFR (Completed)   CBC with Differential/Platelet (Completed)   Urinalysis, Routine w reflex microscopic (Completed)   CMV abs, IgG+IgM (cytomegalovirus)   Epstein-Barr Virus VCA Antibody Panel (Completed)  [place] Antibodies, IGG and IGM   CK (Creatine Kinase) (Completed)   TSH (Completed)   DG Chest 2 View   [place] Abs w/Reflex to Titer   POC COVID-19 (Completed)   POCT Influenza A/B (Completed)   Dizziness       Relevant Orders   Sedimentation rate (Completed)   C-reactive protein (Completed)   COMPLETE METABOLIC PANEL WITH GFR (Completed)   CBC with Differential/Platelet (Completed)   Urinalysis, Routine w reflex microscopic (Completed)   CMV abs, IgG+IgM (cytomegalovirus)   Epstein-Barr Virus VCA Antibody Panel (Completed)   [place] Antibodies, IGG and IGM   CK (Creatine Kinase) (Completed)   TSH (Completed)   DG Chest 2 View   [place] Abs w/Reflex to Titer   POC COVID-19 (Completed)   POCT Influenza A/B (Completed)   Blood in stool       Relevant Orders   Sedimentation rate (Completed)   C-reactive  protein (Completed)   COMPLETE METABOLIC PANEL WITH GFR (Completed)   CBC with Differential/Platelet (Completed)   Urinalysis, Routine w reflex microscopic (Completed)   CMV abs, IgG+IgM (cytomegalovirus)   Epstein-Barr Virus VCA Antibody Panel (Completed)   [place] Antibodies, IGG and IGM   CK (Creatine Kinase) (Completed)   TSH (Completed)   DG Chest 2 View   [place] Abs w/Reflex to Titer       He has a conglomeration of symptoms.  The headaches have been present for at least 6 weeks.  But he is gradually developed other symptoms such as the vertigo.  He did experience the vertigo after turning his head to the right and sitting up during the Dix-Hallpike maneuver.    Headaches-unclear etiology it certainly may be related to the other symptoms.  But MRI was negative.  We will go ahead and get a cervical film today some of it could be stemming from his neck or some type of viral illness.  Myalgias-we will check for elevated white count, thyroid disorder, elevation and muscle enzyme level as well as EBV and CMV.  There are no rash or fever currently.  He did travel to [place] and to [place] recently so we will check for [employer] as well as [place].  We will call with results once available.  In the meantime make sure you are staying well-hydrated.  No follow-ups on file.    Nani Gasser, MD

## 2022-06-24 NOTE — Patient Instructions (Signed)
Please stop the topomax.

## 2022-06-25 NOTE — Progress Notes (Signed)
HI Jeremy House, Your Thyroid is still borderline but it has been there way for years. Your blood count is OK. No sign of blood cancer or severe infection. No sign of anemia. You do have some mild inflammation. Kidney and liver looks good.  Some of the labs are not back et.

## 2022-06-27 ENCOUNTER — Encounter: Payer: Self-pay | Admitting: Family Medicine

## 2022-06-27 NOTE — Progress Notes (Signed)
Jeremy House, your chest x-ray looks good no sign of infection how are you feeling after the weekend?

## 2022-06-28 NOTE — Progress Notes (Signed)
Negative for [place] virus I did check you for this since you were traveling.

## 2022-06-28 NOTE — Progress Notes (Signed)
Hi Jeremy House, you do have some mild degenerative changes or arthritis at C4-5 and C5-6 which is at the lower part of your neck where it comes close to the top of your back.  No worrisome findings.  But you definitely have a little bit of degenerative disc and bone spurs.  I think you would benefit greatly from physical therapy for your neck I think that could even help with your headaches and in fact it could actually be triggering a lot of your headaches.  I have done PT for my neck in the past as well and its made a tremendous difference.  We have a great PT department here or if something closer to home or where you work that you would like to use please let us know.

## 2022-06-30 LAB — CBC WITH DIFFERENTIAL/PLATELET
Absolute Monocytes: 903 cells/uL (ref 200–950)
Basophils Absolute: 76 cells/uL (ref 0–200)
Basophils Relative: 0.8 %
Eosinophils Absolute: 238 cells/uL (ref 15–500)
Eosinophils Relative: 2.5 %
HCT: 42.7 % (ref 38.5–50.0)
Hemoglobin: 14.9 g/dL (ref 13.2–17.1)
Lymphs Abs: 1568 cells/uL (ref 850–3900)
MCH: 30.7 pg (ref 27.0–33.0)
MCHC: 34.9 g/dL (ref 32.0–36.0)
MCV: 88 fL (ref 80.0–100.0)
MPV: 10.3 fL (ref 7.5–12.5)
Monocytes Relative: 9.5 %
Neutro Abs: 6717 cells/uL (ref 1500–7800)
Neutrophils Relative %: 70.7 %
Platelets: 280 10*3/uL (ref 140–400)
RBC: 4.85 10*6/uL (ref 4.20–5.80)
RDW: 12.6 % (ref 11.0–15.0)
Total Lymphocyte: 16.5 %
WBC: 9.5 10*3/uL (ref 3.8–10.8)

## 2022-06-30 LAB — CHIKUNGUNYA ABS W/REFLEX TO TITER
Chikungunya IgG Screen: NEGATIVE
Chikungunya IgM Screen: NEGATIVE

## 2022-06-30 LAB — URINALYSIS, ROUTINE W REFLEX MICROSCOPIC
Bilirubin Urine: NEGATIVE
Glucose, UA: NEGATIVE
Hgb urine dipstick: NEGATIVE
Ketones, ur: NEGATIVE
Leukocytes,Ua: NEGATIVE
Nitrite: NEGATIVE
Protein, ur: NEGATIVE
Specific Gravity, Urine: 1.023 (ref 1.001–1.035)
pH: <=5 (ref 5.0–8.0)

## 2022-06-30 LAB — COMPLETE METABOLIC PANEL WITH GFR
AG Ratio: 1.7 (calc) (ref 1.0–2.5)
ALT: 20 U/L (ref 9–46)
AST: 15 U/L (ref 10–35)
Albumin: 4.6 g/dL (ref 3.6–5.1)
Alkaline phosphatase (APISO): 67 U/L (ref 35–144)
BUN: 18 mg/dL (ref 7–25)
CO2: 26 mmol/L (ref 20–32)
Calcium: 9.4 mg/dL (ref 8.6–10.3)
Chloride: 106 mmol/L (ref 98–110)
Creat: 1.19 mg/dL (ref 0.70–1.35)
Globulin: 2.7 g/dL (calc) (ref 1.9–3.7)
Glucose, Bld: 87 mg/dL (ref 65–99)
Potassium: 4 mmol/L (ref 3.5–5.3)
Sodium: 142 mmol/L (ref 135–146)
Total Bilirubin: 0.4 mg/dL (ref 0.2–1.2)
Total Protein: 7.3 g/dL (ref 6.1–8.1)
eGFR: 69 mL/min/{1.73_m2} (ref >=60)

## 2022-06-30 LAB — WEST NILE ANTIBODIES, IGG AND IGM
West Nile Virus, IgG: <1.3 index (ref <1.30)
West Nile Virus, IgM: <0.9 index (ref <0.90)

## 2022-06-30 LAB — EPSTEIN-BARR VIRUS VCA ANTIBODY PANEL
EBV NA IgG: 181 U/mL — ABNORMAL HIGH
EBV VCA IgG: <18 U/mL
EBV VCA IgM: <36 U/mL

## 2022-06-30 LAB — SEDIMENTATION RATE: Sed Rate: 25 mm/h — ABNORMAL HIGH (ref 0–20)

## 2022-06-30 LAB — TSH: TSH: 4.69 mIU/L — ABNORMAL HIGH (ref 0.40–4.50)

## 2022-06-30 LAB — C-REACTIVE PROTEIN: CRP: 22.8 mg/L — ABNORMAL HIGH (ref <8.0)

## 2022-06-30 LAB — CK: Total CK: 90 U/L (ref 44–196)

## 2022-06-30 LAB — CMV ABS, IGG+IGM (CYTOMEGALOVIRUS)
CMV IgM: <30 AU/mL
Cytomegalovirus Ab-IgG: <0.6 U/mL

## 2022-07-06 NOTE — Progress Notes (Signed)
Hi Chritopher, last test which was for an additional mosquito borne virus is negative which is reassuring I hope you are feeling a little better.

## 2022-07-07 ENCOUNTER — Encounter: Payer: Self-pay | Admitting: Family Medicine

## 2022-07-07 ENCOUNTER — Ambulatory Visit: Payer: BC Managed Care – PPO | Admitting: Family Medicine

## 2022-07-07 VITALS — BP 133/38 | HR 67 | Ht 71.0 in | Wt 226.0 lb

## 2022-07-07 DIAGNOSIS — R519 Headache, unspecified: Secondary | ICD-10-CM | POA: Diagnosis not present

## 2022-07-07 DIAGNOSIS — M542 Cervicalgia: Secondary | ICD-10-CM | POA: Diagnosis not present

## 2022-07-07 DIAGNOSIS — I10 Essential (primary) hypertension: Secondary | ICD-10-CM | POA: Diagnosis not present

## 2022-07-07 MED ORDER — AMITRIPTYLINE HCL 50 MG PO TABS: 50.0000 mg | ORAL_TABLET | Freq: Every day | ORAL | 0 refills | Status: DC

## 2022-07-07 NOTE — Progress Notes (Signed)
   Established Patient Office Visit  Subjective   Patient ID: Jeremy House, male    DOB: December 11, 1960  Age: 61 y.o. MRN: [id number]  Chief Complaint  Patient presents with   Migraine   Hypertension    HPI  Follow-up chronic daily headaches-please see prior notes.  Negative brain MRI.  Topamax caused significant side effects.  He has had a little bit of improvement on 25 mg amitriptyline he is resting better and feels a little bit better in the mornings but then he feels like the headaches build up during the day.  He just feels achy all over in general.  Still also having a lot of soreness and stiffness in his neck.  Hypertension-we had recently cut his HCTZ down to half a tab because he was having some low blood pressures and had him continue the losartan.  Blood pressures have been a little bit more elevated lately.    ROS    Objective:     BP (!) 133/38   Pulse 67   Ht '5\' 11"'$  (1.803 m)   Wt 226 lb (102.5 kg)   SpO2 95%   BMI 31.52 kg/m    Physical Exam Constitutional:      Appearance: He is well-developed.  HENT:     Head: Normocephalic and atraumatic.  Cardiovascular:     Rate and Rhythm: Normal rate and regular rhythm.     Heart sounds: Normal heart sounds.  Pulmonary:     Effort: Pulmonary effort is normal.     Breath sounds: Normal breath sounds.  Skin:    General: Skin is warm and dry.  Neurological:     Mental Status: He is alert and oriented to person, place, and time.  Psychiatric:        Behavior: Behavior normal.      No results found for any visits on 07/07/22.    The 10-year ASCVD risk score (Arnett DK, et al., 2019) is: 13.5%    Assessment & Plan:   Problem List Items Addressed This Visit       Cardiovascular and Mediastinum   Essential hypertension, benign (Chronic)    Ok to go back up to whole tab on HCTZ for BP control if BPs are running higher than 130.         Other   Chronic daily headache    He thinks some of the headaches  could be triggered from his neck so I recommended formal physical therapy and also a trial of increasing the amitriptyline to 50 mg to see if that is more helpful.  I also like to refer him to neurology at this point as I am still not clear why he had the sudden onset of headaches that we cannot get under good control.  It is affecting his day-to-day functioning at this point.      Relevant Medications   amitriptyline (ELAVIL) 50 MG tablet   Other Relevant Orders   Ambulatory referral to Physical Therapy   Ambulatory referral to Neurology   Other Visit Diagnoses     Neck pain    -  Primary   Relevant Medications   amitriptyline (ELAVIL) 50 MG tablet   Other Relevant Orders   Ambulatory referral to Physical Therapy       Return in about 2 months (around 09/06/2022).    Beatrice Lecher, MD

## 2022-07-07 NOTE — Patient Instructions (Addendum)
Ok to go back up to whole tab on HCTZ for BP control if BPs are running higher than 130.

## 2022-07-07 NOTE — Assessment & Plan Note (Signed)
He thinks some of the headaches could be triggered from his neck so I recommended formal physical therapy and also a trial of increasing the amitriptyline to 50 mg to see if that is more helpful.  I also like to refer him to neurology at this point as I am still not clear why he had the sudden onset of headaches that we cannot get under good control.  It is affecting his day-to-day functioning at this point.

## 2022-07-07 NOTE — Assessment & Plan Note (Signed)
Ok to go back up to whole tab on HCTZ for BP control if BPs are running higher than 130.

## 2022-07-07 NOTE — Progress Notes (Signed)
Pt reports that his migraines became more severe on 9/1.   He is only taking 1/2 tab of the HCTZ. He has been doing this since last OV 10/13.

## 2022-07-08 ENCOUNTER — Ambulatory Visit: Payer: BC Managed Care – PPO | Admitting: Family Medicine

## 2022-07-13 ENCOUNTER — Ambulatory Visit (HOSPITAL_BASED_OUTPATIENT_CLINIC_OR_DEPARTMENT_OTHER): Payer: BC Managed Care – PPO | Admitting: Internal Medicine

## 2022-07-13 DIAGNOSIS — R0683 Snoring: Secondary | ICD-10-CM

## 2022-07-14 ENCOUNTER — Other Ambulatory Visit: Payer: Self-pay

## 2022-07-14 ENCOUNTER — Ambulatory Visit: Payer: BC Managed Care – PPO | Attending: Family Medicine | Admitting: Physical Therapy

## 2022-07-14 DIAGNOSIS — R519 Headache, unspecified: Secondary | ICD-10-CM | POA: Diagnosis not present

## 2022-07-14 DIAGNOSIS — R293 Abnormal posture: Secondary | ICD-10-CM

## 2022-07-14 DIAGNOSIS — M542 Cervicalgia: Secondary | ICD-10-CM | POA: Insufficient documentation

## 2022-07-14 NOTE — Therapy (Signed)
OUTPATIENT PHYSICAL THERAPY CERVICAL EVALUATION   Patient Name: Jeremy House MRN: [id number] DOB:[birth year], 61 y.o., male Today's Date: 07/14/2022   PT End of Session - 07/14/22 1016     Visit Number 1    Number of Visits 12    Date for PT Re-Evaluation 08/25/22    Authorization Type BCBS    PT Start Time 1015    PT Stop Time 1100    PT Time Calculation (min) 45 min    Activity Tolerance Patient tolerated treatment well    Behavior During Therapy [facility] for tasks assessed/performed             Past Medical History:  Diagnosis Date   Essential hypertension, benign 07/19/2013   Past Surgical History:  Procedure Laterality Date   PILONIDAL CYST EXCISION  1982   Patient Active Problem List   Diagnosis Date Noted   Chronic daily headache 07/07/2022   Nuclear sclerotic cataract of left eye 10/21/2021   Migraine 10/15/2019   Benign prostatic hyperplasia with nocturia 10/15/2019   Snoring 06/04/2019   Hypertriglyceridemia 07/23/2013   Essential hypertension, benign 07/19/2013    PCP: Hali Marry, MD  REFERRING PROVIDER: Hali Marry, MD  REFERRING DIAG: M54.2 (ICD-10-CM) - Neck pain R51.9 (ICD-10-CM) - Chronic daily headache  THERAPY DIAG:  Abnormal posture  Intractable headache, unspecified chronicity pattern, unspecified headache type  Rationale for Evaluation and Treatment: Rehabilitation  ONSET DATE: Sept 1  SUBJECTIVE:                                                                                                                                                                                                         SUBJECTIVE STATEMENT: Pt reports history of headaches. Pt has been referred to neurologist. Pt states headaches have been going on daily for ~2 months. Pt getting work up -- has also had fatigue. Medication currently is not able to address it. Does note sore/stiffness in the neck. Headaches worsen later into the day.   PERTINENT  HISTORY:  N/a  PAIN:  Are you having pain? Yes: NPRS scale: stays around 5-6, at worst 8 or 9/10 Pain location: Front of head between brows Pain description: Headache Aggravating factors: Moving around/doing activities Relieving factors: Has not found   PRECAUTIONS: None  WEIGHT BEARING RESTRICTIONS: No  FALLS:  Has patient fallen in last 6 months? No  LIVING ENVIRONMENT: Lives with: lives with their family Lives in: House/apartment   OCCUPATION: Full time -- Ecologist (mostly desk work)   PLOF: Independent  PATIENT GOALS: Decrease headache  OBJECTIVE:   DIAGNOSTIC FINDINGS:  X-ray 10/13: Mild degenerative changes at C4-5 and C5-6. No evidence for acute abnormality.  MRI 9/26: IMPRESSION: Unremarkable MRI appearance of the brain for age. No evidence of acute intracranial abnormality.    PATIENT SURVEYS:  FOTO did not assess  COGNITION: Overall cognitive status: Within functional limits for tasks assessed  SENSATION: WFL  POSTURE: rounded shoulders and forward head  PALPATION: Some TTP R UT and mid thoracic mobs. No overt tenderness of suboccipitals or cervical paraspinals. Hypomobile thoracic spine with spring testing   CERVICAL ROM:   Active ROM A/PROM (deg) eval  Flexion 35  Extension 60 feels it back of neck  Right lateral flexion 42  Left lateral flexion 40  Right rotation 69  Left rotation 70   (Blank rows = not tested)  UPPER EXTREMITY ROM:  Active ROM Right eval Left eval  Shoulder flexion    Shoulder extension    Shoulder abduction    Shoulder adduction    Shoulder extension    Shoulder internal rotation    Shoulder external rotation    Elbow flexion    Elbow extension    Wrist flexion    Wrist extension    Wrist ulnar deviation    Wrist radial deviation    Wrist pronation    Wrist supination     (Blank rows = not tested)  UPPER EXTREMITY MMT:  MMT Right eval Left eval  Shoulder flexion 5 5  Shoulder  extension    Shoulder abduction 4+ 4+  Shoulder adduction    Shoulder extension    Shoulder internal rotation 5 5  Shoulder external rotation 4+ 4+  Middle trapezius 4+ 4+  Lower trapezius 3+ 3+  Elbow flexion    Elbow extension    Wrist flexion    Wrist extension    Wrist ulnar deviation    Wrist radial deviation    Wrist pronation    Wrist supination    Grip strength     (Blank rows = not tested)  CERVICAL SPECIAL TESTS:  Spurling's test: Negative and Distraction test: Negative   TODAY'S TREATMENT:                                                                                                                              DATE: 07/14/22 See HEP below  PATIENT EDUCATION:  Education details: Exam findings, POC, initial HEP Person educated: Patient Education method: Explanation Education comprehension: verbalized understanding and returned demonstration  HOME EXERCISE PROGRAM: Access Code: [email or web address] URL: [email or web address] Date: 07/14/2022 Prepared by: Estill Bamberg April Thurnell Garbe  Exercises - Doorway Pec Stretch at 60 Elevation  - 1 x daily - 7 x weekly - 2 sets - 30 sec hold - Doorway Pec Stretch at 90 Degrees Abduction  - 1 x daily - 7 x weekly - 2 sets - 30 sec hold - Doorway Pec Stretch at 120 Degrees Abduction  - 1 x daily - 7  x weekly - 2 sets - 30 sec hold - Seated Scapular Retraction  - 1 x daily - 7 x weekly - 2 sets - 10 reps - 3 sec hold - Seated Cervical Retraction  - 1 x daily - 7 x weekly - 2 sets - 10 reps - 3 sec hold  ASSESSMENT:  CLINICAL IMPRESSION: Patient is a 61 y.o. M who was seen today for physical therapy evaluation and treatment for headaches. No gross trigger points found besides from UT; however, pt does demo hypomobile thoracic spine with some posterior shoulder girdle weakness. Where pt feels headache (between eyebrows) does not seem to fit typical radicular patterns but is most consistent with SCM trigger points. Notes  headaches increase with more activities (notes increase in headache at end of PT evaluation) but it remains constant in nature with other symptoms such as fatigue and altered sense of taste. Pt is interested in trial of PT to decrease his headaches until he gets further neurology work up. Discussed performing 2 week trial to see if any improvement in his symptoms.    OBJECTIVE IMPAIRMENTS: decreased activity tolerance, decreased mobility, decreased strength, increased fascial restrictions, impaired UE functional use, postural dysfunction, and pain.   ACTIVITY LIMITATIONS: carrying, lifting, and caring for others  PARTICIPATION LIMITATIONS: cleaning, interpersonal relationship, community activity, and occupation  PERSONAL FACTORS: Time since onset of injury/illness/exacerbation are also affecting patient's functional outcome.   REHAB POTENTIAL: Fair trial of PT to further assess rehab potential  CLINICAL DECISION MAKING: Stable/uncomplicated  EVALUATION COMPLEXITY: Low   GOALS: Goals reviewed with patient? Yes  SHORT TERM GOALS: Target date: 07/28/2022   Pt will be ind with initial HEP Baseline: newly provided Goal status: INITIAL  2.  Pt will report reduction in headache intensity by >/=25% Baseline: 5 to 6 constant, at worst 8 to 9 Goal status: INITIAL   LONG TERM GOALS: Target date: 08/25/2022  Pt will be ind with advancing and progressing HEP Baseline:  Goal status: INITIAL  2.  Pt will demo at least 10 deg improvement in cervical ROM forneck extensibility Baseline: see above Goal status: INITIAL  3.  Pt will report >/=50% improvement in headache symptoms Baseline:  Goal status: INITIAL  4.  Pt will be ind with maintaining proper posture for work tasks Baseline:  Goal status: INITIAL     PLAN:  PT FREQUENCY: 2x/week  PT DURATION: 6 weeks (trial of 2 weeks; however, will request 6 weeks in case pt does get some improvements)  PLANNED INTERVENTIONS:  Therapeutic exercises, Therapeutic activity, Neuromuscular re-education, Balance training, Gait training, Patient/Family education, Self Care, Joint mobilization, Dry Needling, Electrical stimulation, Spinal mobilization, Cryotherapy, Moist heat, Taping, Traction, Ionotophoresis '4mg'$ /ml Dexamethasone, Manual therapy, and Re-evaluation  PLAN FOR NEXT SESSION: Manual work as indicated -- trial of TPDN? Progress posterior shoulder strengthening and cervical ROM/stretching.    Jamile Rekowski April Ma L Richardine Peppers, PT 07/14/2022, 11:51 AM

## 2022-07-15 ENCOUNTER — Ambulatory Visit (HOSPITAL_BASED_OUTPATIENT_CLINIC_OR_DEPARTMENT_OTHER): Payer: BC Managed Care – PPO | Attending: Family Medicine | Admitting: Internal Medicine

## 2022-07-15 DIAGNOSIS — R0683 Snoring: Secondary | ICD-10-CM | POA: Diagnosis not present

## 2022-07-15 DIAGNOSIS — G4736 Sleep related hypoventilation in conditions classified elsewhere: Secondary | ICD-10-CM | POA: Insufficient documentation

## 2022-07-15 DIAGNOSIS — G4733 Obstructive sleep apnea (adult) (pediatric): Secondary | ICD-10-CM | POA: Diagnosis not present

## 2022-07-18 NOTE — Therapy (Signed)
OUTPATIENT PHYSICAL THERAPY TREATMENT   Patient Name: Jeremy House MRN: [id number] DOB:01/07/1961, 61 y.o., male Today's Date: 07/19/2022   PT End of Session - 07/19/22 1147     Visit Number 2    Number of Visits 12    Date for PT Re-Evaluation 08/25/22    Authorization Type [employer]    PT Start Time 1145    PT Stop Time 1228    PT Time Calculation (min) 43 min    Activity Tolerance Patient tolerated treatment well    Behavior During Therapy [facility] for tasks assessed/performed              Past Medical History:  Diagnosis Date   Essential hypertension, benign 07/19/2013   Past Surgical History:  Procedure Laterality Date   PILONIDAL CYST EXCISION  1982   Patient Active Problem List   Diagnosis Date Noted   Chronic daily headache 07/07/2022   Nuclear sclerotic cataract of left eye 10/21/2021   Migraine 10/15/2019   Benign prostatic hyperplasia with nocturia 10/15/2019   Snoring 06/04/2019   Hypertriglyceridemia 07/23/2013   Essential hypertension, benign 07/19/2013    PCP: Hali Marry, MD  REFERRING PROVIDER: Hali Marry, MD  REFERRING DIAG: M54.2 (ICD-10-CM) - Neck pain R51.9 (ICD-10-CM) - Chronic daily headache  THERAPY DIAG:  Abnormal posture  Intractable headache, unspecified chronicity pattern, unspecified headache type  Rationale for Evaluation and Treatment: Rehabilitation  ONSET DATE: Sept 1  SUBJECTIVE:                                                                                                                                                                                                         SUBJECTIVE STATEMENT: No change in sx  PERTINENT HISTORY:  N/a  PAIN:  Are you having pain? Yes: NPRS scale: stays around 5-6, at worst 8 or 9/10 Pain location: Front of head between brows Pain description: Headache Aggravating factors: Moving around/doing activities Relieving factors: Has not found   PRECAUTIONS:  None  WEIGHT BEARING RESTRICTIONS: No  FALLS:  Has patient fallen in last 6 months? No  LIVING ENVIRONMENT: Lives with: lives with their family Lives in: House/apartment   OCCUPATION: Full time -- Ecologist (mostly desk work)   PLOF: Independent  PATIENT GOALS: Decrease headache  OBJECTIVE:   DIAGNOSTIC FINDINGS:  X-ray 10/13: Mild degenerative changes at C4-5 and C5-6. No evidence for acute abnormality.  MRI 9/26: IMPRESSION: Unremarkable MRI appearance of the brain for age. No evidence of acute intracranial abnormality.    PATIENT SURVEYS:  FOTO did not  assess  COGNITION: Overall cognitive status: Within functional limits for tasks assessed  SENSATION: WFL  POSTURE: rounded shoulders and forward head  PALPATION: Some TTP R UT and mid thoracic mobs. No overt tenderness of suboccipitals or cervical paraspinals. Hypomobile thoracic spine with spring testing   CERVICAL ROM:   Active ROM A/PROM (deg) eval  Flexion 35  Extension 60 feels it back of neck  Right lateral flexion 42  Left lateral flexion 40  Right rotation 69  Left rotation 70   (Blank rows = not tested)  UPPER EXTREMITY ROM:  Active ROM Right eval Left eval  Shoulder flexion    Shoulder extension    Shoulder abduction    Shoulder adduction    Shoulder extension    Shoulder internal rotation    Shoulder external rotation    Elbow flexion    Elbow extension    Wrist flexion    Wrist extension    Wrist ulnar deviation    Wrist radial deviation    Wrist pronation    Wrist supination     (Blank rows = not tested)  UPPER EXTREMITY MMT:  MMT Right eval Left eval  Shoulder flexion 5 5  Shoulder extension    Shoulder abduction 4+ 4+  Shoulder adduction    Shoulder extension    Shoulder internal rotation 5 5  Shoulder external rotation 4+ 4+  Middle trapezius 4+ 4+  Lower trapezius 3+ 3+  Elbow flexion    Elbow extension    Wrist flexion    Wrist extension     Wrist ulnar deviation    Wrist radial deviation    Wrist pronation    Wrist supination    Grip strength     (Blank rows = not tested)  CERVICAL SPECIAL TESTS:  Spurling's test: Negative and Distraction test: Negative   TODAY'S TREATMENT:                                                                                                                              DATE:   07/19/22 Therapeutic Exercise: to improve flexibility, strength and mobility.  Verbal and tactile cues throughout for technique UBE L4 x 4 min  Doorway stretches 60, 90 and 120 deg x 30 sec ea Seated Scap retraction 3 sec hold x 10 Standing neck retraction 3 sec hold x 10 SCM stretch bil 2x 30 sec Seated cervical Rotation 5 sec hold x 5 each way (some dizziness reported) Thoracic extension over bolster - not tolerated due to pressure in head  Manual Therapy: to decrease muscle spasm and pain and improve mobility STM to left UT, levator L first rib mob for assessment with some discomfort reported PA mobs to thoracic spine Gd II/III   07/14/22 See HEP below  PATIENT EDUCATION:  Education details: HEP update, DN handout Person educated: Patient Education method: Explanation Education comprehension: verbalized understanding and returned demonstration  HOME EXERCISE PROGRAM: Access Code: [email or web address] URL: [email or web address] Date:  07/19/2022 Prepared by: Almyra Free  Exercises - Doorway Pec Stretch at 60 Elevation  - 1 x daily - 7 x weekly - 2 sets - 30 sec hold - Doorway Pec Stretch at 90 Degrees Abduction  - 1 x daily - 7 x weekly - 2 sets - 30 sec hold - Doorway Pec Stretch at 120 Degrees Abduction  - 1 x daily - 7 x weekly - 2 sets - 30 sec hold - Seated Scapular Retraction  - 1 x daily - 7 x weekly - 2 sets - 10 reps - 3 sec hold - Seated Cervical Retraction  - 1 x daily - 7 x weekly - 2 sets - 10 reps - 3 sec hold - Sternocleidomastoid Stretch  - 2 x daily - 7 x weekly - 1 sets - 3 reps - 30  hold  Patient Education - Trigger Point Dry Needling ASSESSMENT:  CLINICAL IMPRESSION: Noble presents today with no change in HA sx. During treatment he had multiple episodes of dizziness with head motions and would benefit from vestibular assessment. He would also benefit from DN to left UT, LS and possibly cspine. He has pain at around T7 with PA mobs and pain in paraspinals at same level on L. Didn't tolerate thoracic extension due to increased pressure in head.   OBJECTIVE IMPAIRMENTS: decreased activity tolerance, decreased mobility, decreased strength, increased fascial restrictions, impaired UE functional use, postural dysfunction, and pain.   ACTIVITY LIMITATIONS: carrying, lifting, and caring for others  PARTICIPATION LIMITATIONS: cleaning, interpersonal relationship, community activity, and occupation  PERSONAL FACTORS: Time since onset of injury/illness/exacerbation are also affecting patient's functional outcome.   REHAB POTENTIAL: Fair trial of PT to further assess rehab potential  CLINICAL DECISION MAKING: Stable/uncomplicated  EVALUATION COMPLEXITY: Low   GOALS: Goals reviewed with patient? Yes  SHORT TERM GOALS: Target date: 07/28/2022   Pt will be ind with initial HEP Baseline: newly provided Goal status: INITIAL  2.  Pt will report reduction in headache intensity by >/=25% Baseline: 5 to 6 constant, at worst 8 to 9 Goal status: INITIAL   LONG TERM GOALS: Target date: 08/25/2022  Pt will be ind with advancing and progressing HEP Baseline:  Goal status: INITIAL  2.  Pt will demo at least 10 deg improvement in cervical ROM forneck extensibility Baseline: see above Goal status: INITIAL  3.  Pt will report >/=50% improvement in headache symptoms Baseline:  Goal status: INITIAL  4.  Pt will be ind with maintaining proper posture for work tasks Baseline:  Goal status: INITIAL     PLAN:  PT FREQUENCY: 2x/week  PT DURATION: 6 weeks (trial of 2  weeks; however, will request 6 weeks in case pt does get some improvements)  PLANNED INTERVENTIONS: Therapeutic exercises, Therapeutic activity, Neuromuscular re-education, Balance training, Gait training, Patient/Family education, Self Care, Joint mobilization, Dry Needling, Electrical stimulation, Spinal mobilization, Cryotherapy, Moist heat, Taping, Traction, Ionotophoresis '4mg'$ /ml Dexamethasone, Manual therapy, and Re-evaluation  PLAN FOR NEXT SESSION:  trial of TPDN left UT. LS, cspine. Vestibular Assessment. Progress posterior shoulder strengthening and cervical ROM/stretching.    Jessilyn Catino, PT 07/19/2022, 12:38 PM

## 2022-07-19 ENCOUNTER — Encounter: Payer: Self-pay | Admitting: Physical Therapy

## 2022-07-19 ENCOUNTER — Ambulatory Visit: Payer: BC Managed Care – PPO | Admitting: Physical Therapy

## 2022-07-19 DIAGNOSIS — R519 Headache, unspecified: Secondary | ICD-10-CM | POA: Diagnosis not present

## 2022-07-19 DIAGNOSIS — M542 Cervicalgia: Secondary | ICD-10-CM | POA: Diagnosis not present

## 2022-07-19 DIAGNOSIS — R293 Abnormal posture: Secondary | ICD-10-CM

## 2022-07-21 ENCOUNTER — Encounter: Payer: Self-pay | Admitting: Physical Therapy

## 2022-07-21 ENCOUNTER — Ambulatory Visit: Payer: BC Managed Care – PPO | Admitting: Physical Therapy

## 2022-07-21 DIAGNOSIS — M542 Cervicalgia: Secondary | ICD-10-CM | POA: Diagnosis not present

## 2022-07-21 DIAGNOSIS — R293 Abnormal posture: Secondary | ICD-10-CM

## 2022-07-21 DIAGNOSIS — R519 Headache, unspecified: Secondary | ICD-10-CM | POA: Diagnosis not present

## 2022-07-21 NOTE — Therapy (Signed)
OUTPATIENT PHYSICAL THERAPY TREATMENT   Patient Name: BUNNY LOWDERMILK MRN: [id number] DOB:10-15-1960, 61 y.o., male Today's Date: 07/21/2022   PT End of Session - 07/21/22 1531     Visit Number 3    Number of Visits 12    Date for PT Re-Evaluation 08/25/22    Authorization Type BCBS    PT Start Time 1531    PT Stop Time 1914    PT Time Calculation (min) 34 min    Activity Tolerance Patient tolerated treatment well    Behavior During Therapy [facility] for tasks assessed/performed              Past Medical History:  Diagnosis Date   Essential hypertension, benign 07/19/2013   Past Surgical History:  Procedure Laterality Date   PILONIDAL CYST EXCISION  1982   Patient Active Problem List   Diagnosis Date Noted   Chronic daily headache 07/07/2022   Nuclear sclerotic cataract of left eye 10/21/2021   Migraine 10/15/2019   Benign prostatic hyperplasia with nocturia 10/15/2019   Snoring 06/04/2019   Hypertriglyceridemia 07/23/2013   Essential hypertension, benign 07/19/2013    PCP: Hali Marry, MD  REFERRING PROVIDER: Hali Marry, MD  REFERRING DIAG: M54.2 (ICD-10-CM) - Neck pain R51.9 (ICD-10-CM) - Chronic daily headache  THERAPY DIAG:  Abnormal posture  Intractable headache, unspecified chronicity pattern, unspecified headache type  Rationale for Evaluation and Treatment: Rehabilitation  ONSET DATE: Sept 1  SUBJECTIVE:                                                                                                                                                                                                         SUBJECTIVE STATEMENT: Pt arrives without change in symptoms, notes that he feels [facility] treatment thus far has not been helpful and his neck has actually seemed a bit worse this week. No other new updates reported  PERTINENT HISTORY:  N/a  PAIN:  Are you having pain? Yes: NPRS scale: stays around 5-6, at worst 8 or 9/10 Pain location:  Front of head between brows Pain description: Headache Aggravating factors: Moving around/doing activities Relieving factors: Has not found   PRECAUTIONS: None  WEIGHT BEARING RESTRICTIONS: No  FALLS:  Has patient fallen in last 6 months? No  LIVING ENVIRONMENT: Lives with: lives with their family Lives in: House/apartment   OCCUPATION: Full time -- Ecologist (mostly desk work)   PLOF: Independent  PATIENT GOALS: Decrease headache  OBJECTIVE:   DIAGNOSTIC FINDINGS:  X-ray 10/13: Mild degenerative changes at C4-5 and C5-6. No evidence  for acute abnormality.  MRI 9/26: IMPRESSION: Unremarkable MRI appearance of the brain for age. No evidence of acute intracranial abnormality.    PATIENT SURVEYS:  FOTO did not assess  COGNITION: Overall cognitive status: Within functional limits for tasks assessed  SENSATION: WFL  POSTURE: rounded shoulders and forward head  PALPATION: Some TTP R UT and mid thoracic mobs. No overt tenderness of suboccipitals or cervical paraspinals. Hypomobile thoracic spine with spring testing   CERVICAL ROM:   Active ROM A/PROM (deg) eval  Flexion 35  Extension 60 feels it back of neck  Right lateral flexion 42  Left lateral flexion 40  Right rotation 69  Left rotation 70   (Blank rows = not tested)  UPPER EXTREMITY ROM:  Active ROM Right eval Left eval  Shoulder flexion    Shoulder extension    Shoulder abduction    Shoulder adduction    Shoulder extension    Shoulder internal rotation    Shoulder external rotation    Elbow flexion    Elbow extension    Wrist flexion    Wrist extension    Wrist ulnar deviation    Wrist radial deviation    Wrist pronation    Wrist supination     (Blank rows = not tested)  UPPER EXTREMITY MMT:  MMT Right eval Left eval  Shoulder flexion 5 5  Shoulder extension    Shoulder abduction 4+ 4+  Shoulder adduction    Shoulder extension    Shoulder internal rotation 5 5   Shoulder external rotation 4+ 4+  Middle trapezius 4+ 4+  Lower trapezius 3+ 3+  Elbow flexion    Elbow extension    Wrist flexion    Wrist extension    Wrist ulnar deviation    Wrist radial deviation    Wrist pronation    Wrist supination    Grip strength     (Blank rows = not tested)  CERVICAL SPECIAL TESTS:  Spurling's test: Negative and Distraction test: Negative   VISUAL TESTING 07/21/2022: Mild nausea/dizziness with smooth pursuits and saccades to R Concordant symptoms with gaze stability activity Coordination testing unremarkable   TODAY'S TREATMENT:                                                                                                                              [facility] Adult PT Treatment:                                                DATE: 07/21/22  Neuromuscular re-ed: Vestibular screen as above Gaze stabilization exercises (slow, minimal ROM head turns while fixed on target) 3 bouts to fatigue Therapeutic Activity: 101mn treadmill walking with gradual increase in pace up to 1.8 mph to assess symptom response with fatigue, mild increase in symptoms that improves with rest,  vital signs WNL Education as below  07/19/22 Therapeutic Exercise: to improve flexibility, strength and mobility.  Verbal and tactile cues throughout for technique UBE L4 x 4 min  Doorway stretches 60, 90 and 120 deg x 30 sec ea Seated Scap retraction 3 sec hold x 10 Standing neck retraction 3 sec hold x 10 SCM stretch bil 2x 30 sec Seated cervical Rotation 5 sec hold x 5 each way (some dizziness reported) Thoracic extension over bolster - not tolerated due to pressure in head  Manual Therapy: to decrease muscle spasm and pain and improve mobility STM to left UT, levator L first rib mob for assessment with some discomfort reported PA mobs to thoracic spine Gd II/III   07/14/22 See HEP below  PATIENT EDUCATION:  Education details: HEP update, DN handout Person educated:  Patient Education method: Explanation Education comprehension: verbalized understanding and returned demonstration  HOME EXERCISE PROGRAM: Access Code: [email or web address] URL: [email or web address] Date: 07/19/2022 Prepared by: Almyra Free  Exercises - Doorway Pec Stretch at 60 Elevation  - 1 x daily - 7 x weekly - 2 sets - 30 sec hold - Doorway Pec Stretch at 90 Degrees Abduction  - 1 x daily - 7 x weekly - 2 sets - 30 sec hold - Doorway Pec Stretch at 120 Degrees Abduction  - 1 x daily - 7 x weekly - 2 sets - 30 sec hold - Seated Scapular Retraction  - 1 x daily - 7 x weekly - 2 sets - 10 reps - 3 sec hold - Seated Cervical Retraction  - 1 x daily - 7 x weekly - 2 sets - 10 reps - 3 sec hold - Sternocleidomastoid Stretch  - 2 x daily - 7 x weekly - 1 sets - 3 reps - 30 hold  Patient Education - Trigger Point Dry Needling ASSESSMENT:  CLINICAL IMPRESSION: Pt presents to session with no overt change in symptoms aside from noting that his neck seems more stiff with exercises. Majority of today's session spent assessing symptom response to activity - in summary, symptoms are able to be provoked via gaze stabilization exercises, nausea/dizziness reported with both smooth pursuits and saccades towards R only. Symptoms are also able to be provoked with treadmill gradually increasing speed up to 1.42mh, VSS and symptoms improve with rest. Initiated gaze stabilization exercises on this date. Education on exam findings; as medical workup thus far has been relatively unremarkable (per pt and chart review), pt would likely benefit from more in depth vestibular assessment as pt states he does not feel that musculoskeletal treatment thus far has been helpful. No adverse events, pt departs pt without change in symptoms compared to arrival, all voiced questions answered appropriately from PT perspective.    OBJECTIVE IMPAIRMENTS: decreased activity tolerance, decreased mobility, decreased strength,  increased fascial restrictions, impaired UE functional use, postural dysfunction, and pain.   ACTIVITY LIMITATIONS: carrying, lifting, and caring for others  PARTICIPATION LIMITATIONS: cleaning, interpersonal relationship, community activity, and occupation  PERSONAL FACTORS: Time since onset of injury/illness/exacerbation are also affecting patient's functional outcome.   REHAB POTENTIAL: Fair trial of PT to further assess rehab potential  CLINICAL DECISION MAKING: Stable/uncomplicated  EVALUATION COMPLEXITY: Low   GOALS: Goals reviewed with patient? Yes  SHORT TERM GOALS: Target date: 07/28/2022   Pt will be ind with initial HEP Baseline: newly provided Goal status: INITIAL  2.  Pt will report reduction in headache intensity by >/=25% Baseline: 5 to 6 constant, at worst 8 to 9 Goal status: INITIAL  LONG TERM GOALS: Target date: 08/25/2022  Pt will be ind with advancing and progressing HEP Baseline:  Goal status: INITIAL  2.  Pt will demo at least 10 deg improvement in cervical ROM forneck extensibility Baseline: see above Goal status: INITIAL  3.  Pt will report >/=50% improvement in headache symptoms Baseline:  Goal status: INITIAL  4.  Pt will be ind with maintaining proper posture for work tasks Baseline:  Goal status: INITIAL     PLAN:  PT FREQUENCY: 2x/week  PT DURATION: 6 weeks (trial of 2 weeks; however, will request 6 weeks in case pt does get some improvements)  PLANNED INTERVENTIONS: Therapeutic exercises, Therapeutic activity, Neuromuscular re-education, Balance training, Gait training, Patient/Family education, Self Care, Joint mobilization, Dry Needling, Electrical stimulation, Spinal mobilization, Cryotherapy, Moist heat, Taping, Traction, Ionotophoresis '4mg'$ /ml Dexamethasone, Manual therapy, and Re-evaluation  PLAN FOR NEXT SESSION: more in depth assessment of vestibular system   Leeroy Cha PT, DPT 07/21/2022 4:20 PM

## 2022-07-23 DIAGNOSIS — R0683 Snoring: Secondary | ICD-10-CM

## 2022-07-23 NOTE — Procedures (Signed)
     Patient Name: Jeremy House, [place] Date: 07/17/2022 Gender: Male D.O.B: [birth year] Age (years): 61 Referring Provider: Beatrice Lecher Height (inches): 59 Interpreting Physician: Baird Lyons MD, ABSM Weight (lbs): 220 RPSGT: Jacolyn Reedy BMI: 31 MRN: [id number] Neck Size: 17.50  CLINICAL INFORMATION Sleep Study Type: HST Indication for sleep study:  OSA Epworth Sleepiness Score: 4  SLEEP STUDY TECHNIQUE A multi-channel overnight portable sleep study was performed. The channels recorded were: nasal airflow, thoracic respiratory movement, and oxygen saturation with a pulse oximetry. Snoring was also monitored.  MEDICATIONS Patient self administered medications include: none reported.  SLEEP ARCHITECTURE Patient was studied for 325.3 minutes. The sleep efficiency was 100.0 % and the patient was supine for 0%. The arousal index was 0.0 per hour.  RESPIRATORY PARAMETERS The overall AHI was 62.2 per hour, with a central apnea index of 0 per hour. The oxygen nadir was 79% during sleep.  CARDIAC DATA Mean heart rate during sleep was 65.3 bpm.  IMPRESSIONS - Severe obstructive sleep apnea occurred during this study (AHI = 62.2/h). - Oxygen desaturation was noted during this study (Min O2 = 79%). Mean 93%. Time with O2 saturation 89% or less was 33 minutes. - Patient snored.  DIAGNOSIS - Obstructive Sleep Apnea (G47.33) - Nocturnal Hypoxemia (G47.36)  RECOMMENDATIONS - Suggest CPAP titration sleep study or autopap. Other options would be based on clinical judgment. - Be careful with alcohol, sedatives and other CNS depressants that may worsen sleep apnea and disrupt normal sleep architecture. - Sleep hygiene should be reviewed to assess factors that may improve sleep quality. - Weight management and regular exercise should be initiated or continued.  [Electronically signed] 07/23/2022 11:56 AM  Baird Lyons MD, ABSM Diplomate, American Board of Sleep  Medicine NPI: [id number]                        [place]  ELECTRONICALLY SIGNED ON:  07/23/2022, 11:44 AM [place] PH: [phone]   FX: [phone] [place]

## 2022-07-25 ENCOUNTER — Telehealth: Payer: Self-pay | Admitting: Family Medicine

## 2022-07-25 ENCOUNTER — Encounter: Payer: Self-pay | Admitting: Physical Therapy

## 2022-07-25 ENCOUNTER — Ambulatory Visit: Payer: BC Managed Care – PPO | Admitting: Physical Therapy

## 2022-07-25 DIAGNOSIS — M542 Cervicalgia: Secondary | ICD-10-CM | POA: Diagnosis not present

## 2022-07-25 DIAGNOSIS — R519 Headache, unspecified: Secondary | ICD-10-CM

## 2022-07-25 DIAGNOSIS — R293 Abnormal posture: Secondary | ICD-10-CM

## 2022-07-25 NOTE — Telephone Encounter (Signed)
Hi Jeremy House, lets get you scheduled this week to go over your sleep study.

## 2022-07-25 NOTE — Telephone Encounter (Signed)
ok 

## 2022-07-25 NOTE — Therapy (Addendum)
OUTPATIENT PHYSICAL THERAPY TREATMENT AND DISCHARGE   Patient Name: Jeremy House MRN: [id number] DOB:[birth year], 61 y.o., male Today's Date: 07/25/2022  PHYSICAL THERAPY DISCHARGE SUMMARY  Visits from Start of Care: 4  Current functional level related to goals / functional outcomes: See below   Remaining deficits: Continued pain and headaches   Education / Equipment: See below   Patient agrees to discharge. Patient goals were not met. Patient is being discharged due to lack of progress.    PT End of Session - 07/25/22 1536     Visit Number 4    Number of Visits 12    Date for PT Re-Evaluation 08/25/22    Authorization Type BCBS    PT Start Time 1536    PT Stop Time 3154    PT Time Calculation (min) 39 min    Activity Tolerance Patient tolerated treatment well    Behavior During Therapy WFL for tasks assessed/performed              Past Medical History:  Diagnosis Date   Essential hypertension, benign 07/19/2013   Past Surgical History:  Procedure Laterality Date   PILONIDAL CYST EXCISION  1982   Patient Active Problem List   Diagnosis Date Noted   Chronic daily headache 07/07/2022   Nuclear sclerotic cataract of left eye 10/21/2021   Migraine 10/15/2019   Benign prostatic hyperplasia with nocturia 10/15/2019   Snoring 06/04/2019   Hypertriglyceridemia 07/23/2013   Essential hypertension, benign 07/19/2013    PCP: Hali Marry, MD  REFERRING PROVIDER: Hali Marry, MD  REFERRING DIAG: M54.2 (ICD-10-CM) - Neck pain R51.9 (ICD-10-CM) - Chronic daily headache  THERAPY DIAG:  Abnormal posture  Intractable headache, unspecified chronicity pattern, unspecified headache type  Rationale for Evaluation and Treatment: Rehabilitation  ONSET DATE: Sept 1  SUBJECTIVE:                                                                                                                                                                                                          SUBJECTIVE STATEMENT: Pt reports no changes in symptoms. Pt states he still hasn't heard from the neurologist's office.   PERTINENT HISTORY:  N/a  PAIN:  Are you having pain? Yes: NPRS scale: stays around 5-6, at worst 8 or 9/10 Pain location: Front of head between brows Pain description: Headache Aggravating factors: Moving around/doing activities Relieving factors: Has not found   PRECAUTIONS: None  WEIGHT BEARING RESTRICTIONS: No  FALLS:  Has patient fallen in last 6 months? No  LIVING ENVIRONMENT: Lives with:  lives with their family Lives in: House/apartment   OCCUPATION: Full time -- Ecologist (mostly desk work)   PLOF: Independent  PATIENT GOALS: Decrease headache  OBJECTIVE:   DIAGNOSTIC FINDINGS:  X-ray 10/13: Mild degenerative changes at C4-5 and C5-6. No evidence for acute abnormality.  MRI 9/26: IMPRESSION: Unremarkable MRI appearance of the brain for age. No evidence of acute intracranial abnormality.    PATIENT SURVEYS:  FOTO did not assess  COGNITION: Overall cognitive status: Within functional limits for tasks assessed  SENSATION: WFL  POSTURE: rounded shoulders and forward head  PALPATION: Some TTP R UT and mid thoracic mobs. No overt tenderness of suboccipitals or cervical paraspinals. Hypomobile thoracic spine with spring testing   CERVICAL ROM:   Active ROM A/PROM (deg) eval  Flexion 35  Extension 60 feels it back of neck  Right lateral flexion 42  Left lateral flexion 40  Right rotation 69  Left rotation 70   (Blank rows = not tested)  UPPER EXTREMITY ROM:  Active ROM Right eval Left eval  Shoulder flexion    Shoulder extension    Shoulder abduction    Shoulder adduction    Shoulder extension    Shoulder internal rotation    Shoulder external rotation    Elbow flexion    Elbow extension    Wrist flexion    Wrist extension    Wrist ulnar deviation    Wrist radial  deviation    Wrist pronation    Wrist supination     (Blank rows = not tested)  UPPER EXTREMITY MMT:  MMT Right eval Left eval  Shoulder flexion 5 5  Shoulder extension    Shoulder abduction 4+ 4+  Shoulder adduction    Shoulder extension    Shoulder internal rotation 5 5  Shoulder external rotation 4+ 4+  Middle trapezius 4+ 4+  Lower trapezius 3+ 3+  Elbow flexion    Elbow extension    Wrist flexion    Wrist extension    Wrist ulnar deviation    Wrist radial deviation    Wrist pronation    Wrist supination    Grip strength     (Blank rows = not tested)  CERVICAL SPECIAL TESTS:  Spurling's test: Negative and Distraction test: Negative   VISUAL TESTING 07/21/2022: Mild nausea/dizziness with smooth pursuits and saccades to R Concordant symptoms with gaze stability activity Coordination testing unremarkable  VESTIBULAR ASSESSMENT 07/25/22:   SYMPTOM BEHAVIOR:  Subjective history: Notes that first symptoms in September were of dizziness. States for a few nights after his initial spell, he was unsteady and would stagger at night to got to the bathroom (this is not the case any longer). Reports some dizziness with quick pivots and head turns.   Non-Vestibular symptoms: headaches and migraine symptoms  Type of dizziness: Unsteady with head/body turns  Frequency: Daily  Duration: A few minutes to seconds  Aggravating factors: Induced by motion: turning body quickly and turning head quickly, Worse with fatigue, and Moving eyes  Relieving factors: slow movements  Progression of symptoms: better  OCULOMOTOR EXAM:  Ocular Alignment: normal  Ocular ROM: No Limitations  Spontaneous Nystagmus: absent  Gaze-Induced Nystagmus: absent  Smooth Pursuits: intact  Saccades:  Dizziness with horizontal, WFL vertical  FRENZEL - FIXATION SUPRESSED: unable to assess  VESTIBULAR - OCULAR REFLEX:   Slow VOR: Comment: Dizziness with horizontal head turns (rates symptoms 2/5) >  vertical head turns  VOR fast: Increased dizziness 3/5 horizontal  VOR Cancellation: Normal  Head-Impulse Test: HIT Right: negative HIT Left: negative  Dynamic Visual Acuity: Static: line 9 Dynamic: Line 8   POSITIONAL TESTING: No reports of dizziness in certain positions   MOTION SENSITIVITY:    Motion Sensitivity Quotient Intensity: 0 = none, 1 = Lightheaded, 2 = Mild, 3 = Moderate, 4 = Severe, 5 = Vomiting  Intensity  1. Sitting to supine   2. Supine to L side   3. Supine to R side   4. Supine to sitting   5. L Hallpike-Dix 3.5/5  6. Up from L  0/5  7. R Hallpike-Dix 0/5  8. Up from R  0/5  9. Sitting, head  tipped to L knee  0/5  10. Head up from L  knee 0/5  11. Sitting, head  tipped to R knee 0/5  12. Head up from R  knee 0/5  13. Sitting head turns x5   14.Sitting head nods x5   15. In stance, 180  turn to L    16. In stance, 180  turn to R    Modified CTSIB: Condition 1: 30 sec, condition 2: 30 sec, condition 3: 30 sec, condition 4: 30 sec  TODAY'S TREATMENT:                                                                                                                              [facility] Adult PT Treatment:                                                DATE: 07/25/22 Neuromuscular re-ed: Smooth pursuit horizontal 2x30 sec Saccades horizontal 2x30 sec VOR x 1 2x30 sec horizontal and vertical Brandt daroff x 5 L&R    [facility] Adult PT Treatment:                                                DATE: 07/21/22  Neuromuscular re-ed: Vestibular screen as above Gaze stabilization exercises (slow, minimal ROM head turns while fixed on target) 3 bouts to fatigue Therapeutic Activity: 47mn treadmill walking with gradual increase in pace up to 1.8 mph to assess symptom response with fatigue, mild increase in symptoms that improves with rest, vital signs WNL Education as below  07/19/22 Therapeutic Exercise: to improve flexibility, strength and mobility.  Verbal and  tactile cues throughout for technique UBE L4 x 4 min  Doorway stretches 60, 90 and 120 deg x 30 sec ea Seated Scap retraction 3 sec hold x 10 Standing neck retraction 3 sec hold x 10 SCM stretch bil 2x 30 sec Seated cervical Rotation 5 sec hold x 5 each way (some dizziness reported) Thoracic extension over bolster - not tolerated due  to pressure in head  Manual Therapy: to decrease muscle spasm and pain and improve mobility STM to left UT, levator L first rib mob for assessment with some discomfort reported PA mobs to thoracic spine Gd II/III   07/14/22 See HEP below  PATIENT EDUCATION:  Education details: HEP update, DN handout Person educated: Patient Education method: Explanation Education comprehension: verbalized understanding and returned demonstration  HOME EXERCISE PROGRAM: Access Code: [email or web address] URL: [email or web address] Date: 07/19/2022 Prepared by: Almyra Free  Exercises - Doorway Pec Stretch at 60 Elevation  - 1 x daily - 7 x weekly - 2 sets - 30 sec hold - Doorway Pec Stretch at 90 Degrees Abduction  - 1 x daily - 7 x weekly - 2 sets - 30 sec hold - Doorway Pec Stretch at 120 Degrees Abduction  - 1 x daily - 7 x weekly - 2 sets - 30 sec hold - Seated Scapular Retraction  - 1 x daily - 7 x weekly - 2 sets - 10 reps - 3 sec hold - Seated Cervical Retraction  - 1 x daily - 7 x weekly - 2 sets - 10 reps - 3 sec hold - Sternocleidomastoid Stretch  - 2 x daily - 7 x weekly - 1 sets - 3 reps - 30 hold - Seated Horizontal Saccades  - 1 x daily - 7 x weekly - 2 sets - 30 sec hold - Seated Horizontal Smooth Pursuit  - 1 x daily - 7 x weekly - 2 sets - 30 sec hold - Seated Gaze Stabilization with Head Rotation  - 1 x daily - 7 x weekly - 2 sets - 30 sec hold - Brandt-Daroff Vestibular Exercise  - 1 x daily - 7 x weekly - 1 sets - 5 reps  Patient Education - Trigger Point Dry Needling ASSESSMENT:  CLINICAL IMPRESSION: Session focused on vestibular work up. Pt  reports most symptoms with L Dix-Hallpike position; however, no nystagmus noted. Notes that symptoms in this position would come in waves and ease off. Initiated oculomotor and gaze stabilization exercises.    OBJECTIVE IMPAIRMENTS: decreased activity tolerance, decreased mobility, decreased strength, increased fascial restrictions, impaired UE functional use, postural dysfunction, and pain.   ACTIVITY LIMITATIONS: carrying, lifting, and caring for others  PARTICIPATION LIMITATIONS: cleaning, interpersonal relationship, community activity, and occupation  PERSONAL FACTORS: Time since onset of injury/illness/exacerbation are also affecting patient's functional outcome.   REHAB POTENTIAL: Fair trial of PT to further assess rehab potential  CLINICAL DECISION MAKING: Stable/uncomplicated  EVALUATION COMPLEXITY: Low   GOALS: Goals reviewed with patient? Yes  SHORT TERM GOALS: Target date: 07/28/2022   Pt will be ind with initial HEP Baseline: newly provided Goal status: INITIAL  2.  Pt will report reduction in headache intensity by >/=25% Baseline: 5 to 6 constant, at worst 8 to 9 Goal status: INITIAL   LONG TERM GOALS: Target date: 08/25/2022  Pt will be ind with advancing and progressing HEP Baseline:  Goal status: INITIAL  2.  Pt will demo at least 10 deg improvement in cervical ROM forneck extensibility Baseline: see above Goal status: INITIAL  3.  Pt will report >/=50% improvement in headache symptoms Baseline:  Goal status: INITIAL  4.  Pt will be ind with maintaining proper posture for work tasks Baseline:  Goal status: INITIAL     PLAN:  PT FREQUENCY: 2x/week  PT DURATION: 6 weeks (trial of 2 weeks; however, will request 6 weeks in case pt does get some improvements)  PLANNED  INTERVENTIONS: Therapeutic exercises, Therapeutic activity, Neuromuscular re-education, Balance training, Gait training, Patient/Family education, Self Care, Joint mobilization, Dry  Needling, Electrical stimulation, Spinal mobilization, Cryotherapy, Moist heat, Taping, Traction, Ionotophoresis 58m/ml Dexamethasone, Manual therapy, and Re-evaluation  PLAN FOR NEXT SESSION: Continue gaze stabilization and habituation exercises as indicated   Therma Lasure April Ma L Mineola Duan, PT, DPT 07/25/2022 3:36 PM

## 2022-07-26 NOTE — Telephone Encounter (Signed)
Patient scheduled for 10:50 am  ( arriving at 10:40am) Thursday 12/26/21. =kph

## 2022-07-27 ENCOUNTER — Ambulatory Visit: Payer: BC Managed Care – PPO | Admitting: Physical Therapy

## 2022-07-28 ENCOUNTER — Ambulatory Visit: Payer: BC Managed Care – PPO | Admitting: Family Medicine

## 2022-07-28 ENCOUNTER — Telehealth: Payer: Self-pay | Admitting: Family Medicine

## 2022-07-28 ENCOUNTER — Encounter: Payer: Self-pay | Admitting: Family Medicine

## 2022-07-28 VITALS — BP 115/64 | HR 68 | Ht 71.0 in | Wt 223.0 lb

## 2022-07-28 DIAGNOSIS — G4733 Obstructive sleep apnea (adult) (pediatric): Secondary | ICD-10-CM | POA: Insufficient documentation

## 2022-07-28 MED ORDER — AMBULATORY NON FORMULARY MEDICATION: 0 refills | Status: AC

## 2022-07-28 NOTE — Assessment & Plan Note (Signed)
Reviewed sleep study with him indicating severe obstructive sleep apnea with an AHI of 62.2/h.  We discussed working on weight loss ad lib. to see him lose about 20 pounds.  Also encouraged him to avoid sleeping on his back.  Avoid sedatives.  And discussed treatment options predominantly CPAP.  We will place order today to get him started.  We will follow-up in 1 month once he has been using his CPAP so that we can set his pressure and work through any issues that he may be having.  Hoping this will help with his physical symptoms he has been experiencing lately.

## 2022-07-28 NOTE — Progress Notes (Signed)
   Established Patient Office Visit  Subjective   Patient ID: Jeremy House, male    DOB: 1961/05/22  Age: 61 y.o. MRN: [id number]  Chief Complaint  Patient presents with   Results    Sleep study    HPI   Here today to follow-up on recent sleep study results.  He reported snoring and has been really struggling with some chronic headaches for few months at this point.  He did mention that he decided to go gluten-free and dairy free about a week ago and probably had one of his best days in regards to his headaches yesterday so he is hopeful for today as well.  He has never been formally tested for sleep apnea before.  He did have a home sleep study performed on July 17, 2022.  Results revealed an AHI of 62.2/h with a central apnea index of 0.  Oxygen dropped as low as 79%.  Spent a total of 33 minutes with a oxygen level less than 89%.    ROS    Objective:     BP 115/64   Pulse 68   Ht '5\' 11"'$  (1.803 m)   Wt 223 lb (101.2 kg)   SpO2 98%   BMI 31.10 kg/m    Physical Exam Vitals reviewed.  Constitutional:      Appearance: He is well-developed.  HENT:     Head: Normocephalic and atraumatic.  Eyes:     Conjunctiva/sclera: Conjunctivae normal.  Cardiovascular:     Rate and Rhythm: Normal rate.  Pulmonary:     Effort: Pulmonary effort is normal.  Skin:    General: Skin is dry.     Coloration: Skin is not pale.  Neurological:     Mental Status: He is alert and oriented to person, place, and time.  Psychiatric:        Behavior: Behavior normal.   No results found for any visits on 07/28/22.    The 10-year ASCVD risk score (Arnett DK, et al., 2019) is: 10.6%    Assessment & Plan:   Problem List Items Addressed This Visit       Respiratory   Severe obstructive sleep apnea - Primary    Reviewed sleep study with him indicating severe obstructive sleep apnea with an AHI of 62.2/h.  We discussed working on weight loss ad lib. to see him lose about 20 pounds.  Also  encouraged him to avoid sleeping on his back.  Avoid sedatives.  And discussed treatment options predominantly CPAP.  We will place order today to get him started.  We will follow-up in 1 month once he has been using his CPAP so that we can set his pressure and work through any issues that he may be having.  Hoping this will help with his physical symptoms he has been experiencing lately.      Relevant Medications   AMBULATORY NON FORMULARY MEDICATION    No follow-ups on file.   I spent 25 minutes on the day of the encounter to include pre-visit record review, face-to-face time with the patient and post visit ordering of test.  Beatrice Lecher, MD

## 2022-07-28 NOTE — Telephone Encounter (Signed)
Jeremy House , can you check on this it looks like we sent the referral information about 3 weeks ago to await neurology.  He says he has not heard from them when he came in for his appointment today.

## 2022-08-01 NOTE — Telephone Encounter (Signed)
Contacted the office this was faxed to. They stated they had received the referral and scanned it into the chart but, according to the young lady I spoke with, it had been marked as complete in their system. Referral is back open and she stated they will call to schedule with patient. Jeremy House

## 2022-08-05 ENCOUNTER — Other Ambulatory Visit: Payer: Self-pay | Admitting: Family Medicine

## 2022-08-24 DIAGNOSIS — G4733 Obstructive sleep apnea (adult) (pediatric): Secondary | ICD-10-CM | POA: Diagnosis not present

## 2022-09-13 ENCOUNTER — Ambulatory Visit: Payer: BC Managed Care – PPO | Admitting: Family Medicine

## 2022-09-24 ENCOUNTER — Other Ambulatory Visit: Payer: Self-pay | Admitting: Family Medicine

## 2022-09-24 DIAGNOSIS — G4733 Obstructive sleep apnea (adult) (pediatric): Secondary | ICD-10-CM | POA: Diagnosis not present

## 2022-09-24 DIAGNOSIS — I10 Essential (primary) hypertension: Secondary | ICD-10-CM

## 2022-09-27 DIAGNOSIS — R519 Headache, unspecified: Secondary | ICD-10-CM | POA: Diagnosis not present

## 2022-09-27 DIAGNOSIS — Z79899 Other long term (current) drug therapy: Secondary | ICD-10-CM | POA: Diagnosis not present

## 2022-10-05 ENCOUNTER — Other Ambulatory Visit: Payer: Self-pay | Admitting: Family Medicine

## 2022-10-25 DIAGNOSIS — G4733 Obstructive sleep apnea (adult) (pediatric): Secondary | ICD-10-CM | POA: Diagnosis not present

## 2022-10-27 ENCOUNTER — Ambulatory Visit: Payer: BC Managed Care – PPO | Admitting: Family Medicine

## 2022-10-27 ENCOUNTER — Encounter: Payer: Self-pay | Admitting: Family Medicine

## 2022-10-27 VITALS — BP 117/74 | HR 70 | Ht 71.0 in | Wt 228.1 lb

## 2022-10-27 DIAGNOSIS — I10 Essential (primary) hypertension: Secondary | ICD-10-CM | POA: Diagnosis not present

## 2022-10-27 DIAGNOSIS — G4733 Obstructive sleep apnea (adult) (pediatric): Secondary | ICD-10-CM

## 2022-10-27 DIAGNOSIS — E038 Other specified hypothyroidism: Secondary | ICD-10-CM

## 2022-10-27 DIAGNOSIS — G43811 Other migraine, intractable, with status migrainosus: Secondary | ICD-10-CM

## 2022-10-27 MED ORDER — RIZATRIPTAN BENZOATE 10 MG PO TABS: 10.0000 mg | ORAL_TABLET | Freq: Every day | ORAL | 0 refills | Status: DC | PRN

## 2022-10-27 NOTE — Assessment & Plan Note (Signed)
We were able to look over his information on his app over the last 30 days.  He is still struggling with keeping it on but has been able to reach his average minimum of 4 hours.  Encouraged him to just continue to work at it it does usually get a little easier.  He has been having a lot of leaks though so he is can to try different mask when he gets ready to reorder.  Will also call for a download.  Follow-up in 3 months.

## 2022-10-27 NOTE — Assessment & Plan Note (Signed)
Well controlled. Continue current regimen. Follow up in  6 mo  

## 2022-10-27 NOTE — Assessment & Plan Note (Signed)
Continue med metoprolol but will try switching to a different triptan for rescue to see if he does better with a different one.  Maxalt sent to pharmacy.  Use no more than twice in 1 week but can repeat dose in 2 hours if needed.

## 2022-10-27 NOTE — Progress Notes (Signed)
Established Patient Office Visit  Subjective   Patient ID: Jeremy House, male    DOB: Jan 10, 1961  Age: 62 y.o. MRN: [id number]  Chief Complaint  Patient presents with   Migraine   Hypertension    HPI   Hypertension- Pt denies chest pain, SOB, dizziness, or heart palpitations.  Taking meds as directed w/o problems.  Denies medication side effects.    F/u migraines -  he was hoping his HA would be better using his CPAP but they haven't helped.  He was started on half a tab of metoprolol BID for prophylaxis.  He was given Imitrex to take as needed for rescue he tried taking it 2 separate times and both times it actually caused chest pain so he has not used it since.  F/U OSA -been trying to wear the CPAP each night.  He found initially that he would wake up and have removed the CPAP without realizing it.  Lately he has been trying to wear in bed for couple hours while he is watching TV before going to sleep to see if that helps.  His average usage time is just a little past 4 hours.  Some nights he has been having a lot of leaks up to 30.  But his AHI is pretty consistently less than 4.  He was able to bring up the report on his app.  He would like to have his thyroid rechecked he has had subclinical hypothyroidism for a few years but is concerned that it could be contributing to his headaches and so would like to recheck his levels today.     ROS    Objective:     BP 117/74 (BP Location: Left Arm, Patient Position: Sitting, Cuff Size: Large)   Pulse 70   Ht 5' 11"$  (1.803 m)   Wt 228 lb 1.9 oz (103.5 kg)   SpO2 94%   BMI 31.82 kg/m    Physical Exam Vitals reviewed.  Constitutional:      Appearance: He is well-developed.  HENT:     Head: Normocephalic and atraumatic.  Eyes:     Conjunctiva/sclera: Conjunctivae normal.  Cardiovascular:     Rate and Rhythm: Normal rate.  Pulmonary:     Effort: Pulmonary effort is normal.  Skin:    General: Skin is dry.      Coloration: Skin is not pale.  Neurological:     Mental Status: He is alert and oriented to person, place, and time.  Psychiatric:        Behavior: Behavior normal.      No results found for any visits on 10/27/22.    The 10-year ASCVD risk score (Arnett DK, et al., 2019) is: 10.9%    Assessment & Plan:   Problem List Items Addressed This Visit       Cardiovascular and Mediastinum   Essential hypertension, benign (Chronic)    Well controlled. Continue current regimen. Follow up in  4mo      Relevant Medications   metoprolol tartrate (LOPRESSOR) 25 MG tablet   Migraine - Primary    Continue med metoprolol but will try switching to a different triptan for rescue to see if he does better with a different one.  Maxalt sent to pharmacy.  Use no more than twice in 1 week but can repeat dose in 2 hours if needed.      Relevant Medications   metoprolol tartrate (LOPRESSOR) 25 MG tablet   rizatriptan (MAXALT) 10 MG tablet  Respiratory   Severe obstructive sleep apnea    We were able to look over his information on his app over the last 30 days.  He is still struggling with keeping it on but has been able to reach his average minimum of 4 hours.  Encouraged him to just continue to work at it it does usually get a little easier.  He has been having a lot of leaks though so he is can to try different mask when he gets ready to reorder.  Will also call for a download.  Follow-up in 3 months.        Endocrine   Subclinical hypothyroidism   Relevant Medications   metoprolol tartrate (LOPRESSOR) 25 MG tablet   Other Relevant Orders   TSH    Return in about 3 months (around 01/25/2023) for sleep apnea.    Beatrice Lecher, MD

## 2022-10-28 ENCOUNTER — Other Ambulatory Visit: Payer: Self-pay | Admitting: Family Medicine

## 2022-10-28 ENCOUNTER — Encounter: Payer: Self-pay | Admitting: Family Medicine

## 2022-10-28 DIAGNOSIS — M542 Cervicalgia: Secondary | ICD-10-CM

## 2022-10-28 DIAGNOSIS — R519 Headache, unspecified: Secondary | ICD-10-CM

## 2022-10-28 LAB — TSH: TSH: 3.96 mIU/L (ref 0.40–4.50)

## 2022-10-28 NOTE — Progress Notes (Signed)
Your lab work is within acceptable range and there are no concerning findings.   ?

## 2022-11-20 ENCOUNTER — Other Ambulatory Visit: Payer: Self-pay | Admitting: Family Medicine

## 2022-11-20 DIAGNOSIS — M542 Cervicalgia: Secondary | ICD-10-CM

## 2022-11-20 DIAGNOSIS — R519 Headache, unspecified: Secondary | ICD-10-CM

## 2022-11-23 ENCOUNTER — Other Ambulatory Visit: Payer: Self-pay | Admitting: Family Medicine

## 2022-11-23 DIAGNOSIS — I10 Essential (primary) hypertension: Secondary | ICD-10-CM

## 2022-11-23 DIAGNOSIS — G4733 Obstructive sleep apnea (adult) (pediatric): Secondary | ICD-10-CM | POA: Diagnosis not present

## 2022-11-25 ENCOUNTER — Other Ambulatory Visit: Payer: Self-pay | Admitting: Family Medicine

## 2022-11-25 DIAGNOSIS — G43811 Other migraine, intractable, with status migrainosus: Secondary | ICD-10-CM

## 2022-12-24 DIAGNOSIS — G4733 Obstructive sleep apnea (adult) (pediatric): Secondary | ICD-10-CM | POA: Diagnosis not present

## 2023-01-18 DIAGNOSIS — G43711 Chronic migraine without aura, intractable, with status migrainosus: Secondary | ICD-10-CM | POA: Diagnosis not present

## 2023-01-19 DIAGNOSIS — H43811 Vitreous degeneration, right eye: Secondary | ICD-10-CM | POA: Diagnosis not present

## 2023-01-19 DIAGNOSIS — H524 Presbyopia: Secondary | ICD-10-CM | POA: Diagnosis not present

## 2023-01-19 DIAGNOSIS — H2511 Age-related nuclear cataract, right eye: Secondary | ICD-10-CM | POA: Diagnosis not present

## 2023-01-19 DIAGNOSIS — Z961 Presence of intraocular lens: Secondary | ICD-10-CM | POA: Diagnosis not present

## 2023-01-19 DIAGNOSIS — H5211 Myopia, right eye: Secondary | ICD-10-CM | POA: Diagnosis not present

## 2023-01-23 DIAGNOSIS — G4733 Obstructive sleep apnea (adult) (pediatric): Secondary | ICD-10-CM | POA: Diagnosis not present

## 2023-01-26 ENCOUNTER — Ambulatory Visit: Payer: BC Managed Care – PPO | Admitting: Family Medicine

## 2023-02-23 DIAGNOSIS — G4733 Obstructive sleep apnea (adult) (pediatric): Secondary | ICD-10-CM | POA: Diagnosis not present

## 2023-03-02 DIAGNOSIS — R891 Abnormal level of hormones in specimens from other organs, systems and tissues: Secondary | ICD-10-CM | POA: Diagnosis not present

## 2023-03-02 DIAGNOSIS — E538 Deficiency of other specified B group vitamins: Secondary | ICD-10-CM | POA: Diagnosis not present

## 2023-03-02 DIAGNOSIS — Z1329 Encounter for screening for other suspected endocrine disorder: Secondary | ICD-10-CM | POA: Diagnosis not present

## 2023-03-02 DIAGNOSIS — Z125 Encounter for screening for malignant neoplasm of prostate: Secondary | ICD-10-CM | POA: Diagnosis not present

## 2023-03-02 DIAGNOSIS — B2790 Infectious mononucleosis, unspecified without complication: Secondary | ICD-10-CM | POA: Diagnosis not present

## 2023-03-02 DIAGNOSIS — E785 Hyperlipidemia, unspecified: Secondary | ICD-10-CM | POA: Diagnosis not present

## 2023-03-02 DIAGNOSIS — R519 Headache, unspecified: Secondary | ICD-10-CM | POA: Diagnosis not present

## 2023-03-02 DIAGNOSIS — B960 Mycoplasma pneumoniae [M. pneumoniae] as the cause of diseases classified elsewhere: Secondary | ICD-10-CM | POA: Diagnosis not present

## 2023-03-02 DIAGNOSIS — Z131 Encounter for screening for diabetes mellitus: Secondary | ICD-10-CM | POA: Diagnosis not present

## 2023-03-02 DIAGNOSIS — R700 Elevated erythrocyte sedimentation rate: Secondary | ICD-10-CM | POA: Diagnosis not present

## 2023-03-02 DIAGNOSIS — E559 Vitamin D deficiency, unspecified: Secondary | ICD-10-CM | POA: Diagnosis not present

## 2023-03-25 DIAGNOSIS — G4733 Obstructive sleep apnea (adult) (pediatric): Secondary | ICD-10-CM | POA: Diagnosis not present

## 2023-03-26 ENCOUNTER — Other Ambulatory Visit: Payer: Self-pay | Admitting: Family Medicine

## 2023-03-26 DIAGNOSIS — I10 Essential (primary) hypertension: Secondary | ICD-10-CM

## 2023-04-23 ENCOUNTER — Other Ambulatory Visit: Payer: Self-pay | Admitting: Family Medicine

## 2023-04-23 DIAGNOSIS — I10 Essential (primary) hypertension: Secondary | ICD-10-CM

## 2023-04-25 DIAGNOSIS — G4733 Obstructive sleep apnea (adult) (pediatric): Secondary | ICD-10-CM | POA: Diagnosis not present

## 2023-04-26 DIAGNOSIS — E039 Hypothyroidism, unspecified: Secondary | ICD-10-CM | POA: Diagnosis not present

## 2023-04-26 DIAGNOSIS — Z1329 Encounter for screening for other suspected endocrine disorder: Secondary | ICD-10-CM | POA: Diagnosis not present

## 2023-04-26 DIAGNOSIS — R519 Headache, unspecified: Secondary | ICD-10-CM | POA: Diagnosis not present

## 2023-04-26 DIAGNOSIS — E785 Hyperlipidemia, unspecified: Secondary | ICD-10-CM | POA: Diagnosis not present

## 2023-04-27 ENCOUNTER — Other Ambulatory Visit: Payer: Self-pay | Admitting: Family Medicine

## 2023-04-27 DIAGNOSIS — R519 Headache, unspecified: Secondary | ICD-10-CM

## 2023-04-27 DIAGNOSIS — M542 Cervicalgia: Secondary | ICD-10-CM

## 2023-05-04 DIAGNOSIS — G4733 Obstructive sleep apnea (adult) (pediatric): Secondary | ICD-10-CM | POA: Diagnosis not present

## 2023-05-26 DIAGNOSIS — G4733 Obstructive sleep apnea (adult) (pediatric): Secondary | ICD-10-CM | POA: Diagnosis not present

## 2023-06-18 ENCOUNTER — Other Ambulatory Visit: Payer: Self-pay | Admitting: Family Medicine

## 2023-06-18 DIAGNOSIS — R519 Headache, unspecified: Secondary | ICD-10-CM

## 2023-06-18 DIAGNOSIS — M542 Cervicalgia: Secondary | ICD-10-CM

## 2023-06-25 DIAGNOSIS — G4733 Obstructive sleep apnea (adult) (pediatric): Secondary | ICD-10-CM | POA: Diagnosis not present

## 2023-07-25 DIAGNOSIS — E785 Hyperlipidemia, unspecified: Secondary | ICD-10-CM | POA: Diagnosis not present

## 2023-07-25 DIAGNOSIS — R700 Elevated erythrocyte sedimentation rate: Secondary | ICD-10-CM | POA: Diagnosis not present

## 2023-07-25 DIAGNOSIS — E291 Testicular hypofunction: Secondary | ICD-10-CM | POA: Diagnosis not present

## 2023-07-25 DIAGNOSIS — R519 Headache, unspecified: Secondary | ICD-10-CM | POA: Diagnosis not present

## 2023-07-25 DIAGNOSIS — E559 Vitamin D deficiency, unspecified: Secondary | ICD-10-CM | POA: Diagnosis not present

## 2023-07-25 DIAGNOSIS — E349 Endocrine disorder, unspecified: Secondary | ICD-10-CM | POA: Diagnosis not present

## 2023-07-25 DIAGNOSIS — E039 Hypothyroidism, unspecified: Secondary | ICD-10-CM | POA: Diagnosis not present

## 2023-08-15 DIAGNOSIS — Z1329 Encounter for screening for other suspected endocrine disorder: Secondary | ICD-10-CM | POA: Diagnosis not present

## 2023-08-15 DIAGNOSIS — E039 Hypothyroidism, unspecified: Secondary | ICD-10-CM | POA: Diagnosis not present

## 2023-08-15 DIAGNOSIS — R519 Headache, unspecified: Secondary | ICD-10-CM | POA: Diagnosis not present

## 2023-08-15 DIAGNOSIS — E785 Hyperlipidemia, unspecified: Secondary | ICD-10-CM | POA: Diagnosis not present

## 2023-10-20 ENCOUNTER — Other Ambulatory Visit: Payer: Self-pay | Admitting: Family Medicine

## 2023-10-20 DIAGNOSIS — I10 Essential (primary) hypertension: Secondary | ICD-10-CM

## 2023-10-20 DIAGNOSIS — R519 Headache, unspecified: Secondary | ICD-10-CM

## 2023-10-20 DIAGNOSIS — M542 Cervicalgia: Secondary | ICD-10-CM

## 2023-11-02 ENCOUNTER — Other Ambulatory Visit: Payer: Self-pay

## 2023-11-02 ENCOUNTER — Emergency Department (HOSPITAL_COMMUNITY): Payer: BC Managed Care – PPO

## 2023-11-02 ENCOUNTER — Encounter (HOSPITAL_COMMUNITY): Payer: Self-pay

## 2023-11-02 ENCOUNTER — Emergency Department (HOSPITAL_COMMUNITY)
Admission: EM | Admit: 2023-11-02 | Discharge: 2023-11-02 | Disposition: A | Discharge status: Home / Self Care | Payer: BC Managed Care – PPO | Attending: Emergency Medicine | Admitting: Emergency Medicine

## 2023-11-02 DIAGNOSIS — Y92410 Unspecified street and highway as the place of occurrence of the external cause: Secondary | ICD-10-CM | POA: Diagnosis not present

## 2023-11-02 DIAGNOSIS — S161XXA Strain of muscle, fascia and tendon at neck level, initial encounter: Secondary | ICD-10-CM | POA: Diagnosis not present

## 2023-11-02 DIAGNOSIS — S39012A Strain of muscle, fascia and tendon of lower back, initial encounter: Secondary | ICD-10-CM

## 2023-11-02 DIAGNOSIS — M542 Cervicalgia: Secondary | ICD-10-CM | POA: Diagnosis present

## 2023-11-02 MED ORDER — METHOCARBAMOL 500 MG PO TABS
750.0000 mg | ORAL_TABLET | Freq: Once | ORAL | Status: AC
  Administered 2023-11-02: 750 mg via ORAL
  Filled 2023-11-02: qty 2

## 2023-11-02 MED ORDER — ACETAMINOPHEN 500 MG PO TABS
1000.0000 mg | ORAL_TABLET | Freq: Once | ORAL | Status: AC
  Administered 2023-11-02: 1000 mg via ORAL
  Filled 2023-11-02: qty 2

## 2023-11-02 MED ORDER — METHOCARBAMOL 750 MG PO TABS: 750.0000 mg | ORAL_TABLET | Freq: Three times a day (TID) | ORAL | 0 refills | Status: AC | PRN
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
## 2023-11-02 NOTE — Discharge Instructions (Addendum)
It was our pleasure to provide your ER care today - we hope that you feel better.  Take acetaminophen or ibuprofen as need for pain. You may also take robaxin as need for muscle pain/spasm - no driving when taking.   Follow up with primary care doctor in 1-2 weeks if symptoms fail to improve/resolve.  Return to ER if worse, new symptoms, new/severe pain, trouble breathing, severe headache, abdominal pain, numbness/weakness,  or other emergency concern.
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
## 2023-11-15 ENCOUNTER — Other Ambulatory Visit: Payer: Self-pay | Admitting: Family Medicine

## 2023-11-15 DIAGNOSIS — I10 Essential (primary) hypertension: Secondary | ICD-10-CM

## 2023-11-16 NOTE — Telephone Encounter (Signed)
 Please call pt he will need an appt to get his medications refilled he will also need fasting labs. Thanks.
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
## 2023-12-10 ENCOUNTER — Other Ambulatory Visit: Payer: Self-pay | Admitting: Family Medicine

## 2024-01-01 ENCOUNTER — Other Ambulatory Visit: Payer: Self-pay | Admitting: Family Medicine

## 2024-02-11 ENCOUNTER — Other Ambulatory Visit: Payer: Self-pay | Admitting: Family Medicine

## 2024-02-11 DIAGNOSIS — I10 Essential (primary) hypertension: Secondary | ICD-10-CM

## 2024-04-17 ENCOUNTER — Other Ambulatory Visit: Payer: Self-pay | Admitting: Family Medicine

## 2024-04-17 DIAGNOSIS — I10 Essential (primary) hypertension: Secondary | ICD-10-CM

## 2024-04-17 DIAGNOSIS — M542 Cervicalgia: Secondary | ICD-10-CM

## 2024-04-17 DIAGNOSIS — R519 Headache, unspecified: Secondary | ICD-10-CM

## 2024-04-17 NOTE — Telephone Encounter (Signed)
 Please call pt he will need a f/u appointment for bp and fasting labs. Thanks.

## 2024-05-24 NOTE — Telephone Encounter (Signed)
 Plz call pt for appointment.

## 2024-06-02 ENCOUNTER — Other Ambulatory Visit: Payer: Self-pay | Admitting: Family Medicine

## 2024-06-03 NOTE — Telephone Encounter (Signed)
 Called patient left voicemail to callback and schedule an appointment for BP/LABS

## 2024-06-03 NOTE — Telephone Encounter (Signed)
 Please call pt. He will need an appointment for BP/labs

## 2024-07-09 ENCOUNTER — Other Ambulatory Visit: Payer: Self-pay | Admitting: Family Medicine

## 2024-07-09 DIAGNOSIS — M542 Cervicalgia: Secondary | ICD-10-CM

## 2024-07-09 DIAGNOSIS — I10 Essential (primary) hypertension: Secondary | ICD-10-CM

## 2024-07-09 DIAGNOSIS — R519 Headache, unspecified: Secondary | ICD-10-CM

## 2024-07-10 NOTE — Telephone Encounter (Signed)
 Plz call pt he is OVERDUE for OV and labs

## 2024-07-10 NOTE — Telephone Encounter (Signed)
 Called patient left vm to call back and schedule an appointment and labs
